# Patient Record
Sex: Female | Born: 1935 | Race: White | Hispanic: No | Smoking: Never smoker
Health system: Southern US, Community
[De-identification: ages and names within clinical notes are randomized; demographics above are authoritative.]

## PROBLEM LIST (undated history)

## (undated) DIAGNOSIS — C73 Malignant neoplasm of thyroid gland: Secondary | ICD-10-CM

## (undated) DIAGNOSIS — A6920 Lyme disease, unspecified: Secondary | ICD-10-CM

---

## 2006-07-24 ENCOUNTER — Encounter: Admission: RE | Admit: 2006-07-24 | Discharge: 2006-07-24 | Payer: Self-pay | Admitting: Internal Medicine

## 2019-03-23 ENCOUNTER — Emergency Department (HOSPITAL_COMMUNITY): Payer: Medicare Other

## 2019-03-23 ENCOUNTER — Encounter (HOSPITAL_COMMUNITY): Payer: Self-pay

## 2019-03-23 ENCOUNTER — Other Ambulatory Visit: Payer: Self-pay

## 2019-03-23 ENCOUNTER — Inpatient Hospital Stay (HOSPITAL_COMMUNITY)
Admission: EM | Admit: 2019-03-23 | Discharge: 2019-03-28 | DRG: 580 | Disposition: A | Discharge status: Home Health | Payer: Medicare Other | Attending: Family Medicine | Admitting: Family Medicine

## 2019-03-23 DIAGNOSIS — N179 Acute kidney failure, unspecified: Secondary | ICD-10-CM | POA: Diagnosis present

## 2019-03-23 DIAGNOSIS — E039 Hypothyroidism, unspecified: Secondary | ICD-10-CM | POA: Diagnosis present

## 2019-03-23 DIAGNOSIS — L03115 Cellulitis of right lower limb: Secondary | ICD-10-CM

## 2019-03-23 DIAGNOSIS — N390 Urinary tract infection, site not specified: Secondary | ICD-10-CM | POA: Diagnosis present

## 2019-03-23 DIAGNOSIS — Z79890 Hormone replacement therapy (postmenopausal): Secondary | ICD-10-CM

## 2019-03-23 DIAGNOSIS — Z20822 Contact with and (suspected) exposure to covid-19: Secondary | ICD-10-CM | POA: Diagnosis present

## 2019-03-23 DIAGNOSIS — I739 Peripheral vascular disease, unspecified: Secondary | ICD-10-CM | POA: Diagnosis present

## 2019-03-23 DIAGNOSIS — T148XXA Other injury of unspecified body region, initial encounter: Secondary | ICD-10-CM | POA: Diagnosis present

## 2019-03-23 DIAGNOSIS — D649 Anemia, unspecified: Secondary | ICD-10-CM | POA: Diagnosis present

## 2019-03-23 DIAGNOSIS — Z85850 Personal history of malignant neoplasm of thyroid: Secondary | ICD-10-CM

## 2019-03-23 DIAGNOSIS — F0390 Unspecified dementia without behavioral disturbance: Secondary | ICD-10-CM | POA: Diagnosis present

## 2019-03-23 DIAGNOSIS — E440 Moderate protein-calorie malnutrition: Secondary | ICD-10-CM | POA: Insufficient documentation

## 2019-03-23 DIAGNOSIS — Z8619 Personal history of other infectious and parasitic diseases: Secondary | ICD-10-CM

## 2019-03-23 DIAGNOSIS — Z882 Allergy status to sulfonamides status: Secondary | ICD-10-CM

## 2019-03-23 DIAGNOSIS — R32 Unspecified urinary incontinence: Secondary | ICD-10-CM | POA: Diagnosis present

## 2019-03-23 DIAGNOSIS — E890 Postprocedural hypothyroidism: Secondary | ICD-10-CM | POA: Diagnosis present

## 2019-03-23 DIAGNOSIS — Z681 Body mass index (BMI) 19 or less, adult: Secondary | ICD-10-CM

## 2019-03-23 DIAGNOSIS — I70201 Unspecified atherosclerosis of native arteries of extremities, right leg: Secondary | ICD-10-CM | POA: Diagnosis present

## 2019-03-23 DIAGNOSIS — L0390 Cellulitis, unspecified: Secondary | ICD-10-CM | POA: Diagnosis present

## 2019-03-23 DIAGNOSIS — Z91010 Allergy to peanuts: Secondary | ICD-10-CM

## 2019-03-23 DIAGNOSIS — E870 Hyperosmolality and hypernatremia: Secondary | ICD-10-CM | POA: Diagnosis present

## 2019-03-23 DIAGNOSIS — Z9102 Food additives allergy status: Secondary | ICD-10-CM

## 2019-03-23 DIAGNOSIS — K625 Hemorrhage of anus and rectum: Secondary | ICD-10-CM | POA: Diagnosis present

## 2019-03-23 DIAGNOSIS — M7989 Other specified soft tissue disorders: Secondary | ICD-10-CM | POA: Diagnosis not present

## 2019-03-23 DIAGNOSIS — R159 Full incontinence of feces: Secondary | ICD-10-CM | POA: Diagnosis present

## 2019-03-23 DIAGNOSIS — L97211 Non-pressure chronic ulcer of right calf limited to breakdown of skin: Secondary | ICD-10-CM | POA: Diagnosis present

## 2019-03-23 DIAGNOSIS — Z888 Allergy status to other drugs, medicaments and biological substances status: Secondary | ICD-10-CM

## 2019-03-23 HISTORY — DX: Lyme disease, unspecified: A69.20

## 2019-03-23 HISTORY — DX: Malignant neoplasm of thyroid gland: C73

## 2019-03-23 LAB — CBC WITH DIFFERENTIAL/PLATELET
Abs Immature Granulocytes: 0.03 10*3/uL (ref 0.00–0.07)
Basophils Absolute: 0 10*3/uL (ref 0.0–0.1)
Basophils Relative: 1 %
Eosinophils Absolute: 0.1 10*3/uL (ref 0.0–0.5)
Eosinophils Relative: 3 %
HCT: 33.6 % — ABNORMAL LOW (ref 36.0–46.0)
Hemoglobin: 10.1 g/dL — ABNORMAL LOW (ref 12.0–15.0)
Immature Granulocytes: 1 %
Lymphocytes Relative: 14 %
Lymphs Abs: 0.7 10*3/uL (ref 0.7–4.0)
MCH: 29 pg (ref 26.0–34.0)
MCHC: 30.1 g/dL (ref 30.0–36.0)
MCV: 96.6 fL (ref 80.0–100.0)
Monocytes Absolute: 0.5 10*3/uL (ref 0.1–1.0)
Monocytes Relative: 9 %
Neutro Abs: 3.9 10*3/uL (ref 1.7–7.7)
Neutrophils Relative %: 72 %
Platelets: 221 10*3/uL (ref 150–400)
RBC: 3.48 MIL/uL — ABNORMAL LOW (ref 3.87–5.11)
RDW: 16.3 % — ABNORMAL HIGH (ref 11.5–15.5)
WBC: 5.3 10*3/uL (ref 4.0–10.5)
nRBC: 0 % (ref 0.0–0.2)

## 2019-03-23 LAB — COMPREHENSIVE METABOLIC PANEL
ALT: 23 U/L (ref 0–44)
AST: 24 U/L (ref 15–41)
Albumin: 3.6 g/dL (ref 3.5–5.0)
Alkaline Phosphatase: 91 U/L (ref 38–126)
Anion gap: 10 (ref 5–15)
BUN: 34 mg/dL — ABNORMAL HIGH (ref 8–23)
CO2: 29 mmol/L (ref 22–32)
Calcium: 9 mg/dL (ref 8.9–10.3)
Chloride: 107 mmol/L (ref 98–111)
Creatinine, Ser: 0.67 mg/dL (ref 0.44–1.00)
GFR calc Af Amer: >60 mL/min (ref >60)
GFR calc non Af Amer: >60 mL/min (ref >60)
Glucose, Bld: 87 mg/dL (ref 70–99)
Potassium: 3.7 mmol/L (ref 3.5–5.1)
Sodium: 146 mmol/L — ABNORMAL HIGH (ref 135–145)
Total Bilirubin: 0.3 mg/dL (ref 0.3–1.2)
Total Protein: 6.3 g/dL — ABNORMAL LOW (ref 6.5–8.1)

## 2019-03-23 LAB — LACTIC ACID, PLASMA: Lactic Acid, Venous: 1.1 mmol/L (ref 0.5–1.9)

## 2019-03-23 MED ORDER — SODIUM CHLORIDE 0.9% FLUSH
3.0000 mL | Freq: Once | INTRAVENOUS | Status: AC
  Administered 2019-03-24: 3 mL via INTRAVENOUS

## 2019-03-23 NOTE — ED Triage Notes (Signed)
Pt came in POV c/o of swelling around wound on Right Leg with slight drainage. Area is warm around the wound. Pt states that the wound was seen by a doctor and was being treated. However, now the wound appears to be getting worse per patient.

## 2019-03-23 NOTE — ED Notes (Signed)
Hiram Gash [phone] (sister) escorted PT to ED and is requesting to be called with any and all updates.

## 2019-03-24 ENCOUNTER — Emergency Department (HOSPITAL_COMMUNITY): Payer: Medicare Other

## 2019-03-24 ENCOUNTER — Inpatient Hospital Stay (HOSPITAL_COMMUNITY): Payer: Medicare Other

## 2019-03-24 ENCOUNTER — Encounter (HOSPITAL_COMMUNITY): Payer: Self-pay | Admitting: Internal Medicine

## 2019-03-24 ENCOUNTER — Other Ambulatory Visit: Payer: Self-pay

## 2019-03-24 DIAGNOSIS — E870 Hyperosmolality and hypernatremia: Secondary | ICD-10-CM | POA: Diagnosis present

## 2019-03-24 DIAGNOSIS — L0390 Cellulitis, unspecified: Secondary | ICD-10-CM | POA: Diagnosis not present

## 2019-03-24 DIAGNOSIS — Z681 Body mass index (BMI) 19 or less, adult: Secondary | ICD-10-CM | POA: Diagnosis not present

## 2019-03-24 DIAGNOSIS — T148XXA Other injury of unspecified body region, initial encounter: Secondary | ICD-10-CM | POA: Diagnosis present

## 2019-03-24 DIAGNOSIS — R32 Unspecified urinary incontinence: Secondary | ICD-10-CM | POA: Diagnosis present

## 2019-03-24 DIAGNOSIS — I739 Peripheral vascular disease, unspecified: Secondary | ICD-10-CM | POA: Diagnosis present

## 2019-03-24 DIAGNOSIS — Z8619 Personal history of other infectious and parasitic diseases: Secondary | ICD-10-CM | POA: Diagnosis not present

## 2019-03-24 DIAGNOSIS — N390 Urinary tract infection, site not specified: Secondary | ICD-10-CM | POA: Diagnosis present

## 2019-03-24 DIAGNOSIS — I96 Gangrene, not elsewhere classified: Secondary | ICD-10-CM | POA: Diagnosis not present

## 2019-03-24 DIAGNOSIS — Z20822 Contact with and (suspected) exposure to covid-19: Secondary | ICD-10-CM | POA: Diagnosis present

## 2019-03-24 DIAGNOSIS — E890 Postprocedural hypothyroidism: Secondary | ICD-10-CM | POA: Diagnosis present

## 2019-03-24 DIAGNOSIS — L03115 Cellulitis of right lower limb: Secondary | ICD-10-CM | POA: Diagnosis present

## 2019-03-24 DIAGNOSIS — D649 Anemia, unspecified: Secondary | ICD-10-CM | POA: Diagnosis present

## 2019-03-24 DIAGNOSIS — K625 Hemorrhage of anus and rectum: Secondary | ICD-10-CM | POA: Diagnosis present

## 2019-03-24 DIAGNOSIS — I70201 Unspecified atherosclerosis of native arteries of extremities, right leg: Secondary | ICD-10-CM | POA: Diagnosis present

## 2019-03-24 DIAGNOSIS — L97211 Non-pressure chronic ulcer of right calf limited to breakdown of skin: Secondary | ICD-10-CM

## 2019-03-24 DIAGNOSIS — Z882 Allergy status to sulfonamides status: Secondary | ICD-10-CM | POA: Diagnosis not present

## 2019-03-24 DIAGNOSIS — M7989 Other specified soft tissue disorders: Secondary | ICD-10-CM | POA: Diagnosis present

## 2019-03-24 DIAGNOSIS — Z91010 Allergy to peanuts: Secondary | ICD-10-CM | POA: Diagnosis not present

## 2019-03-24 DIAGNOSIS — I70232 Atherosclerosis of native arteries of right leg with ulceration of calf: Secondary | ICD-10-CM | POA: Diagnosis not present

## 2019-03-24 DIAGNOSIS — Z9102 Food additives allergy status: Secondary | ICD-10-CM | POA: Diagnosis not present

## 2019-03-24 DIAGNOSIS — F0390 Unspecified dementia without behavioral disturbance: Secondary | ICD-10-CM | POA: Diagnosis present

## 2019-03-24 DIAGNOSIS — E440 Moderate protein-calorie malnutrition: Secondary | ICD-10-CM | POA: Insufficient documentation

## 2019-03-24 DIAGNOSIS — E039 Hypothyroidism, unspecified: Secondary | ICD-10-CM | POA: Diagnosis not present

## 2019-03-24 DIAGNOSIS — R159 Full incontinence of feces: Secondary | ICD-10-CM | POA: Diagnosis present

## 2019-03-24 DIAGNOSIS — Z85850 Personal history of malignant neoplasm of thyroid: Secondary | ICD-10-CM | POA: Diagnosis not present

## 2019-03-24 DIAGNOSIS — N179 Acute kidney failure, unspecified: Secondary | ICD-10-CM | POA: Diagnosis present

## 2019-03-24 DIAGNOSIS — Z888 Allergy status to other drugs, medicaments and biological substances status: Secondary | ICD-10-CM | POA: Diagnosis not present

## 2019-03-24 DIAGNOSIS — Z79890 Hormone replacement therapy (postmenopausal): Secondary | ICD-10-CM | POA: Diagnosis not present

## 2019-03-24 LAB — FERRITIN: Ferritin: 93 ng/mL (ref 11–307)

## 2019-03-24 LAB — RETICULOCYTES
Immature Retic Fract: 11.9 % (ref 2.3–15.9)
RBC.: 3.26 MIL/uL — ABNORMAL LOW (ref 3.87–5.11)
Retic Count, Absolute: 31 10*3/uL (ref 19.0–186.0)
Retic Ct Pct: 1 % (ref 0.4–3.1)

## 2019-03-24 LAB — IRON AND TIBC
Iron: 46 ug/dL (ref 28–170)
Saturation Ratios: 19 % (ref 10.4–31.8)
TIBC: 244 ug/dL — ABNORMAL LOW (ref 250–450)
UIBC: 198 ug/dL

## 2019-03-24 LAB — URINALYSIS, ROUTINE W REFLEX MICROSCOPIC
Bilirubin Urine: NEGATIVE
Glucose, UA: NEGATIVE mg/dL
Hgb urine dipstick: NEGATIVE
Ketones, ur: NEGATIVE mg/dL
Nitrite: NEGATIVE
Protein, ur: NEGATIVE mg/dL
Specific Gravity, Urine: 1.019 (ref 1.005–1.030)
pH: 6 (ref 5.0–8.0)

## 2019-03-24 LAB — SARS CORONAVIRUS 2 BY RT PCR (DIASORIN): SARS Coronavirus 2: NEGATIVE

## 2019-03-24 LAB — VITAMIN B12: Vitamin B-12: 586 pg/mL (ref 180–914)

## 2019-03-24 LAB — TSH: TSH: 7.483 u[IU]/mL — ABNORMAL HIGH (ref 0.350–4.500)

## 2019-03-24 LAB — OCCULT BLOOD X 1 CARD TO LAB, STOOL: Fecal Occult Bld: NEGATIVE

## 2019-03-24 LAB — FOLATE: Folate: 26.7 ng/mL (ref >5.9)

## 2019-03-24 LAB — D-DIMER, QUANTITATIVE: D-Dimer, Quant: 0.93 ug/mL-FEU — ABNORMAL HIGH (ref 0.00–0.50)

## 2019-03-24 MED ORDER — VANCOMYCIN HCL 750 MG/150ML IV SOLN
750.0000 mg | INTRAVENOUS | Status: DC
  Administered 2019-03-25: 750 mg via INTRAVENOUS
  Filled 2019-03-24: qty 150

## 2019-03-24 MED ORDER — PANTOPRAZOLE SODIUM 40 MG PO TBEC
40.0000 mg | DELAYED_RELEASE_TABLET | Freq: Once | ORAL | Status: AC
  Administered 2019-03-24: 40 mg via ORAL
  Filled 2019-03-24: qty 1

## 2019-03-24 MED ORDER — ADULT MULTIVITAMIN W/MINERALS CH
1.0000 | ORAL_TABLET | Freq: Every day | ORAL | Status: DC
  Administered 2019-03-24 – 2019-03-28 (×4): 1 via ORAL
  Filled 2019-03-24 (×4): qty 1

## 2019-03-24 MED ORDER — CEFTRIAXONE SODIUM 1 G IJ SOLR
1.0000 g | INTRAMUSCULAR | Status: DC
  Administered 2019-03-24 – 2019-03-27 (×4): 1 g via INTRAVENOUS
  Filled 2019-03-24 (×4): qty 10

## 2019-03-24 MED ORDER — ACETAMINOPHEN 325 MG PO TABS
650.0000 mg | ORAL_TABLET | Freq: Four times a day (QID) | ORAL | Status: DC | PRN
  Administered 2019-03-24 – 2019-03-25 (×2): 650 mg via ORAL
  Filled 2019-03-24 (×2): qty 2

## 2019-03-24 MED ORDER — VANCOMYCIN HCL IN DEXTROSE 1-5 GM/200ML-% IV SOLN
1000.0000 mg | Freq: Once | INTRAVENOUS | Status: AC
  Administered 2019-03-24: 03:00:00 1000 mg via INTRAVENOUS
  Filled 2019-03-24: qty 200

## 2019-03-24 MED ORDER — ACETAMINOPHEN 650 MG RE SUPP: 650.0000 mg | Freq: Four times a day (QID) | RECTAL | Status: DC | PRN

## 2019-03-24 MED ORDER — PRO-STAT SUGAR FREE PO LIQD
30.0000 mL | Freq: Every day | ORAL | Status: DC
  Administered 2019-03-24 – 2019-03-28 (×4): 30 mL via ORAL
  Filled 2019-03-24 (×4): qty 30

## 2019-03-24 MED ORDER — LEVOTHYROXINE SODIUM 88 MCG PO TABS
88.0000 ug | ORAL_TABLET | Freq: Every day | ORAL | Status: DC
  Administered 2019-03-24 – 2019-03-28 (×5): 88 ug via ORAL
  Filled 2019-03-24 (×5): qty 1

## 2019-03-24 NOTE — Progress Notes (Signed)
ABI exam completed.  Preliminary results can be found under CV proc under chart review.  03/24/2019 4:15 PM  Candia Kingsbury, K., RDMS, RVT
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
## 2019-03-24 NOTE — Progress Notes (Signed)
Pharmacy Antibiotic Note  Autumn Sullivan is a 84 y.o. female admitted on 03/23/2019 with R leg wound.  Pharmacy has been consulted for Vancomycin dosing.  Vancomycin 1gm given in ED  Plan: Vancomycin 750 mg IV Q 36 hrs. Goal AUC 400-550. Expected AUC: 500 SCr used: 0.8 Will f/u renal function, micro data, and pt's clinical condition Vanc levels prn   Height: 5\' 1"  (154.9 cm) Weight: 90 lb (40.8 kg) IBW/kg (Calculated) : 47.8  Temp (24hrs), Avg:97.5 F (36.4 C), Min:97.4 F (36.3 C), Max:97.6 F (36.4 C)  Recent Labs  Lab 03/23/19 2140  WBC 5.3  CREATININE 0.67  LATICACIDVEN 1.1    Estimated Creatinine Clearance: 35.5 mL/min (by C-G formula based on SCr of 0.67 mg/dL).    Allergies  Allergen Reactions  . Methylprednisolone     n/v,tingling n/v,tingling   . Peanut-Containing Drug Products Anaphylaxis  . Cholestyramine     chest pain chest pain   . Soybean (Diagnostic)   . Sulfa Antibiotics     Antimicrobials this admission: 2/24 Vanc >>   Microbiology results:  UCx:     Thank you for allowing pharmacy to be a part of this patient's care.  Sherlon Handing, PharmD, BCPS Please see amion for complete clinical pharmacist phone list 03/24/2019 4:09 AM
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
## 2019-03-24 NOTE — Plan of Care (Signed)
  Problem: Skin Integrity: Goal: Risk for impaired skin integrity will decrease Outcome: Progressing   Problem: Pain Managment: Goal: General experience of comfort will improve Outcome: Progressing   Problem: Elimination: Goal: Will not experience complications related to bowel motility Outcome: Progressing   Problem: Nutrition: Goal: Adequate nutrition will be maintained Outcome: Progressing   Problem: Clinical Measurements: Goal: Ability to maintain clinical measurements within normal limits will improve Outcome: Progressing

## 2019-03-24 NOTE — Progress Notes (Signed)
Progress Note    Autumn Sullivan  [id number] DOB: 1935-06-19  DOA: 03/23/2019 PCP: No primary care provider on file.    Brief Narrative:   Chief complaint: cellulitis  Medical records reviewed and are as summarized below:  Autumn Sullivan is an 84 y.o. female medical history significant for thyroid cancer, Lyme disease [facility] emergency department chief complaint cellulitis of a nonhealing wound on her right leg.  Work-up in the emergency department yields a urinary tract infection and cellulitis of nonhealing wound on right leg concerning for ischemic ulcer and PVD.  The biotics were started in the emergency department.  Assessment/Plan:   Principal Problem:   Cellulitis Active Problems:   UTI (urinary tract infection)   Wound of skin   Ischemic ulcer of right calf, limited to breakdown of skin (HCC)   Anemia   PVD (peripheral vascular disease) (HCC)   Hypernatremia   Hypothyroidism   #1.  Cellulitis/possible ischemic ulcer right calf.  Reportedly this is from a wound she sustained 3 years ago that never healed properly.  Last 2 weeks has worsened.  X-ray of right tibia/fibula shows soft tissue ulceration with lateral aspect of the right lower leg with associated edema compatible with cellulitis no evidence of osteomyelitis.  She is afebrile hemodynamically stable and nontoxic-appearing.  Myosin and ceftriaxone initiated.  Valuated by orthopedic surgery who recommend ABI studies with possible vascular consult depending on results.  Also recommend likely needs debridement. -Gentle IV fluids -Continue IV antibiotics -Follow ABI studies -Vascular consult as indicated -Dressing changes per W OC  #2.  Urinary tract infection.  Rocephin started. -Follow urine culture -Gentle IV fluids -Monitor intake and output  #3.  Anemia.  Normocytic.  Hemoglobin 10.2.  Chart review indicates hemoglobin 10.6 in 2018.  Anemia panel reveals TIBC 244 otherwise levels within the limits of normal.  No  sign symptoms of active bleeding. -Monitor  #4.  Hypothyroidism.  Home medications include Synthroid. -Obtain a TSH -Continue Synthroid  #5. Hypernatremia. Sodium level 146. Mild.  We related to decreased oral intake. -Received IV fluids in the emergency department -Recheck in the morning   Family Communication/Anticipated D/C date and plan/Code Status   DVT prophylaxis: Lovenox ordered. Code Status: Full Code.  Family Communication: attempted to call sister who is listed as contact and no answer and no opportunity to leave message Disposition Plan: to be determined   Medical Consultants:    Sharol Given ortho   Anti-Infectives:    None  Subjective:   Lying in bed. Reports pain to lower right leg.   Objective:    Vitals:   03/24/19 0230 03/24/19 0339 03/24/19 0341 03/24/19 0756  BP: (!) 156/62  (!) 151/53 109/85  Pulse: 67 70 63 60  Resp:  16 17 16   Temp:  (!) 97.4 F (36.3 C)  97.9 F (36.6 C)  TempSrc:  Oral  Oral  SpO2: 100% 100% 100% 99%  Weight:      Height:        Intake/Output Summary (Last 24 hours) at 03/24/2019 1303 Last data filed at 03/24/2019 0406 Gross per 24 hour  Intake 300 ml  Output --  Net 300 ml   Filed Weights   03/23/19 2128  Weight: 40.8 kg    Exam: General: Thin frail somewhat pale tonically ill-appearing alert no acute distress CV: Regular rate and rhythm no murmur gallop or rub trace lower extremity edema right foot warm to touch with +DP.  Respiratory: No increased work of breathing  respirations slightly shallow breath sounds are clear bilaterally I hear no crackles no wheezes Abdomen: Nondistended soft positive bowel sounds throughout no guarding or rebounding Neuro: Alert and oriented to self and place.  Speech clear facial symmetry follow simple commands some word searching noted Psychiatric: seems a little paranoid as not wanting to answer questions about where she lives or who she lives with .   Data Reviewed:   I have  personally reviewed following labs and imaging studies:  Labs: Labs show the following:   Basic Metabolic Panel: Recent Labs  Lab 03/23/19 2140  NA 146*  K 3.7  CL 107  CO2 29  GLUCOSE 87  BUN 34*  CREATININE 0.67  CALCIUM 9.0   GFR Estimated Creatinine Clearance: 35.5 mL/min (by C-G formula based on SCr of 0.67 mg/dL). Liver Function Tests: Recent Labs  Lab 03/23/19 2140  AST 24  ALT 23  ALKPHOS 91  BILITOT 0.3  PROT 6.3*  ALBUMIN 3.6   No results for input(s): LIPASE, AMYLASE in the last 168 hours. No results for input(s): AMMONIA in the last 168 hours. Coagulation profile No results for input(s): INR, PROTIME in the last 168 hours.  CBC: Recent Labs  Lab 03/23/19 2140  WBC 5.3  NEUTROABS 3.9  HGB 10.1*  HCT 33.6*  MCV 96.6  PLT 221   Cardiac Enzymes: No results for input(s): CKTOTAL, CKMB, CKMBINDEX, TROPONINI in the last 168 hours. BNP (last 3 results) No results for input(s): PROBNP in the last 8760 hours. CBG: No results for input(s): GLUCAP in the last 168 hours. D-Dimer: Recent Labs    03/24/19 0402  DDIMER 0.93*   Hgb A1c: No results for input(s): HGBA1C in the last 72 hours. Lipid Profile: No results for input(s): CHOL, HDL, LDLCALC, TRIG, CHOLHDL, LDLDIRECT in the last 72 hours. Thyroid function studies: No results for input(s): TSH, T4TOTAL, T3FREE, THYROIDAB in the last 72 hours.  Invalid input(s): FREET3 Anemia work up: Recent Labs    03/24/19 0402  VITAMINB12 586  FOLATE 26.7  FERRITIN 93  TIBC 244*  IRON 46  RETICCTPCT 1.0   Sepsis Labs: Recent Labs  Lab 03/23/19 2140  WBC 5.3  LATICACIDVEN 1.1    Microbiology Recent Results (from the past 240 hour(s))  SARS Coronavirus 2 by RT PCR     Status: None   Collection Time: 03/24/19  2:27 AM  Result Value Ref Range Status   SARS Coronavirus 2 NEGATIVE NEGATIVE Final    Comment: (NOTE) Result indicates the ABSENCE of SARS-CoV-2 RNA in the patient specimen.  The  lowest concentration of SARS-CoV-2 viral copies this assay can detect in nasopharyngeal swab specimens is 500 copies / mL.  A negative result does not preclude SARS-CoV-2 infection and should not be used as the sole basis for patient management decisions. A negative result may occur with improper specimen collection / handling, submission of a specimen other than nasopharyngeal swab, presence of viral mutation(s) within the areas targeted by this assay, and inadequate number of viral copies (<500 copies / mL) present.  Negative results must be combined with clinical observations, patient history, and epidemiological information.  The expected result is NEGATIVE.  Patient Fact Sheet:  BlogSelections.co.uk   Provider Fact Sheet:  https://lucas.com/   This test is not yet approved or cleared by the [place] FDA and  has been authorized for  detection and/or diagnosis of SARS-CoV-2 by FDA under an Emergency Use Authorization (EUA).  This EUA will remain in effect (meaning  this test can be used) for the duration of  the COVID-19 declaration under Section 564(b)(1) of the Act, 21 U.S.C. section 360bbb-3(b)(1), unless the authorization is terminated or revoked sooner Performed at [facility], [place] [address], [place],  [address]     Procedures and diagnostic studies:  DG Chest 2 View  Result Date: 03/23/2019 CLINICAL DATA:  Infection, history of Lyme disease and thyroid cancer EXAM: CHEST - 2 VIEW COMPARISON:  None. FINDINGS: Frontal and lateral views of the chest demonstrate an unremarkable cardiac silhouette. The lungs are hyperinflated with background interstitial prominence compatible with emphysema. No airspace disease, effusion, or pneumothorax. No acute bony abnormalities. IMPRESSION: 1. Emphysema.  No acute airspace disease. Electronically Signed   By: Randa Ngo M.D.   On: 03/23/2019 22:02   DG Tibia/Fibula  Right  Result Date: 03/24/2019 CLINICAL DATA:  Right leg pain and swelling, erythema and drainage EXAM: RIGHT TIBIA AND FIBULA - 2 VIEW COMPARISON:  None. FINDINGS: Frontal and lateral views of the right tibia and fibula are obtained. There are no acute or destructive bony lesions. Soft tissue defect lateral right lower leg consistent with ulceration. There is diffuse soft tissue edema. IMPRESSION: 1. Soft tissue ulceration lateral right lower leg with associated diffuse soft tissue edema. Findings compatible with cellulitis. 2. No acute or destructive bony lesions. Electronically Signed   By: Randa Ngo M.D.   On: 03/24/2019 02:16    Medications:   . levothyroxine  88 mcg Oral Q0600   Continuous Infusions: . cefTRIAXone (ROCEPHIN)  IV 1 g (03/24/19 0406)  . [START ON 03/25/2019] vancomycin       LOS: 0 days   Radene Gunning NP [facility]   How to contact the [facility] Attending or Consulting provider [place] or covering provider during after hours [place], for this patient?  1. Check the care team in [facility] and look for a) attending/consulting [facility] provider listed and b) the [facility] team listed 2. Log into [email or web address] and use [place]'s universal password to access. If you do not have the password, please contact the hospital operator. 3. Locate the [facility] provider you are looking for under [facility] and page to a number that you can be directly reached. 4. If you still have difficulty reaching the provider, please page the [facility] (Director on Call) for the Hospitalists listed on amion for assistance.  03/24/2019, 1:03 PM

## 2019-03-24 NOTE — ED Provider Notes (Signed)
[facility] EMERGENCY DEPARTMENT Provider Note   CSN: [id number] Arrival date & time: 03/23/19  2029     History No chief complaint on file.   Autumn Sullivan is a 84 y.o. female.  Patient is an 84 year old female with history of thyroid cancer and chronic right leg wound.  She presents today for evaluation of pain and swelling to the right leg.  The wound she describes has been present for the past 2 years.  It has worsened over the past several days.  She describes pain, redness, and pus draining from it.  She denies any fevers or chills.  She denies aggravating or alleviating factors.   The history is provided by the patient.       Past Medical History:  Diagnosis Date  . Lyme disease   . Thyroid cancer ([place])     There are no problems to display for this patient.      OB History   No obstetric history on file.     No family history on file.  Social History   Tobacco Use  . Smoking status: Not on file  Substance Use Topics  . Alcohol use: Not on file  . Drug use: Not on file    Home Medications Prior to Admission medications   Not on File    Allergies    Patient has no allergy information on record.  Review of Systems   Review of Systems  All other systems reviewed and are negative.   Physical Exam Updated Vital Signs BP (!) 119/94 (BP Location: Right Arm)   Pulse 85   Temp 97.6 F (36.4 C)   Resp 16   Ht 5\' 1"  (1.549 m)   Wt 40.8 kg   SpO2 100%   BMI 17.01 kg/m   Physical Exam Vitals and nursing note reviewed.  Constitutional:      General: She is not in acute distress.    Appearance: She is well-developed. She is not diaphoretic.  HENT:     Head: Normocephalic and atraumatic.  Cardiovascular:     Rate and Rhythm: Normal rate and regular rhythm.     Heart sounds: No murmur. No friction rub. No gallop.   Pulmonary:     Effort: Pulmonary effort is normal. No respiratory distress.     Breath sounds: Normal breath  sounds. No wheezing.  Abdominal:     General: Bowel sounds are normal. There is no distension.     Palpations: Abdomen is soft.     Tenderness: There is no abdominal tenderness.  Musculoskeletal:        General: Normal range of motion.     Cervical back: Normal range of motion and neck supple.  Skin:    General: Skin is warm and dry.     Comments: The right lower extremity has a 5 cm x 7 cm area of ulcerated tissue.  There is purulent drainage and surrounding tenderness, warmth, and erythema.  DP pulses are palpable and motor and sensation are intact to the foot.  Neurological:     Mental Status: She is alert and oriented to person, place, and time.       ED Results / Procedures / Treatments   Labs (all labs ordered are listed, but only abnormal results are displayed) Labs Reviewed  COMPREHENSIVE METABOLIC PANEL - Abnormal; Notable for the following components:      Result Value   Sodium 146 (*)    BUN 34 (*)  Total Protein 6.3 (*)    All other components within normal limits  CBC WITH DIFFERENTIAL/PLATELET - Abnormal; Notable for the following components:   RBC 3.48 (*)    Hemoglobin 10.1 (*)    HCT 33.6 (*)    RDW 16.3 (*)    All other components within normal limits  URINALYSIS, ROUTINE W REFLEX MICROSCOPIC - Abnormal; Notable for the following components:   APPearance HAZY (*)    Leukocytes,Ua MODERATE (*)    Bacteria, UA MANY (*)    All other components within normal limits  LACTIC ACID, PLASMA  LACTIC ACID, PLASMA    EKG None  Radiology DG Chest 2 View  Result Date: 03/23/2019 CLINICAL DATA:  Infection, history of Lyme disease and thyroid cancer EXAM: CHEST - 2 VIEW COMPARISON:  None. FINDINGS: Frontal and lateral views of the chest demonstrate an unremarkable cardiac silhouette. The lungs are hyperinflated with background interstitial prominence compatible with emphysema. No airspace disease, effusion, or pneumothorax. No acute bony abnormalities. IMPRESSION:  1. Emphysema.  No acute airspace disease. Electronically Signed   By: Randa Ngo M.D.   On: 03/23/2019 22:02    Procedures Procedures (including critical care time)  Medications Ordered in ED Medications  sodium chloride flush (NS) 0.9 % injection 3 mL (has no administration in time range)  vancomycin (VANCOCIN) IVPB 1000 mg/200 mL premix (has no administration in time range)    ED Course  I have reviewed the triage vital signs and the nursing notes.  Pertinent labs & imaging results that were available during my care of the patient were reviewed by me and considered in my medical decision making (see chart for details).    MDM Rules/Calculators/A&P  Patient presenting with a recurring sore to her right lower leg.  She has a sizable area of necrotic tissue with purulent drainage and surrounding erythema.  I feel as though this will require recurrent doses of antibiotics intravenously.  Patient's care has been discussed with Dr. Marlowe Sax who will evaluate and admit.  Final Clinical Impression(s) / ED Diagnoses Final diagnoses:  None    Rx / DC Orders ED Discharge Orders    None       Veryl Speak, MD 03/24/19 [phone]

## 2019-03-24 NOTE — Consult Note (Signed)
ORTHOPAEDIC CONSULTATION  REQUESTING PHYSICIAN: Geradine Girt, DO  Chief Complaint: Painful ulcer lateral aspect right leg.  HPI: Autumn Sullivan is a 84 y.o. female who presents with a painful ulcer lateral aspect right leg secondary to blunt trauma which has been present for over a year.  Patient denies recent history of smoking, she states that she has leg pain which occurs after walking a certain distance she states its only in her right leg not in her left leg.  Past Medical History:  Diagnosis Date  . Lyme disease   . Thyroid cancer ([place])    History reviewed. No pertinent surgical history. Social History   Socioeconomic History  . Marital status: Unknown    Spouse name: Not on file  . Number of children: Not on file  . Years of education: Not on file  . Highest education level: Not on file  Occupational History  . Not on file  Tobacco Use  . Smoking status: Never Smoker  . Smokeless tobacco: Never Used  Substance and Sexual Activity  . Alcohol use: Never  . Drug use: Never  . Sexual activity: Not on file  Other Topics Concern  . Not on file  Social History Narrative  . Not on file   Social Determinants of Health   Financial Resource Strain:   . Difficulty of Paying Living Expenses: Not on file  Food Insecurity:   . Worried About Charity fundraiser in the Last Year: Not on file  . Ran Out of Food in the Last Year: Not on file  Transportation Needs:   . Lack of Transportation (Medical): Not on file  . Lack of Transportation (Non-Medical): Not on file  Physical Activity:   . Days of Exercise per Week: Not on file  . Minutes of Exercise per Session: Not on file  Stress:   . Feeling of Stress : Not on file  Social Connections:   . Frequency of Communication with Friends and Family: Not on file  . Frequency of Social Gatherings with Friends and Family: Not on file  . Attends Religious Services: Not on file  . Active Member of Clubs or Organizations: Not  on file  . Attends Archivist Meetings: Not on file  . Marital Status: Not on file   History reviewed. No pertinent family history. - negative except otherwise stated in the family history section Allergies  Allergen Reactions  . Methylprednisolone     n/v,tingling n/v,tingling   . Peanut-Containing Drug Products Anaphylaxis  . Cholestyramine     chest pain chest pain   . Soybean (Diagnostic)   . Sulfa Antibiotics    Prior to Admission medications   Medication Sig Start Date End Date Taking? Authorizing Provider  levothyroxine (SYNTHROID) 88 MCG tablet Take 88 mcg by mouth daily. 12/17/18  Yes [provider]   DG Chest 2 View  Result Date: 03/23/2019 CLINICAL DATA:  Infection, history of Lyme disease and thyroid cancer EXAM: CHEST - 2 VIEW COMPARISON:  None. FINDINGS: Frontal and lateral views of the chest demonstrate an unremarkable cardiac silhouette. The lungs are hyperinflated with background interstitial prominence compatible with emphysema. No airspace disease, effusion, or pneumothorax. No acute bony abnormalities. IMPRESSION: 1. Emphysema.  No acute airspace disease. Electronically Signed   By: Randa Ngo M.D.   On: 03/23/2019 22:02   DG Tibia/Fibula Right  Result Date: 03/24/2019 CLINICAL DATA:  Right leg pain and swelling, erythema and drainage EXAM: RIGHT TIBIA AND FIBULA -  2 VIEW COMPARISON:  None. FINDINGS: Frontal and lateral views of the right tibia and fibula are obtained. There are no acute or destructive bony lesions. Soft tissue defect lateral right lower leg consistent with ulceration. There is diffuse soft tissue edema. IMPRESSION: 1. Soft tissue ulceration lateral right lower leg with associated diffuse soft tissue edema. Findings compatible with cellulitis. 2. No acute or destructive bony lesions. Electronically Signed   By: Randa Ngo M.D.   On: 03/24/2019 02:16   - pertinent xrays, CT, MRI studies were reviewed and independently  interpreted  Positive ROS: All other systems have been reviewed and were otherwise negative with the exception of those mentioned in the HPI and as above.  Physical Exam: General: Alert, no acute distress Psychiatric: Patient is competent for consent with normal mood and affect Lymphatic: No axillary or cervical lymphadenopathy Cardiovascular: No pedal edema Respiratory: No cyanosis, no use of accessory musculature GI: No organomegaly, abdomen is soft and non-tender    Images:  @ENCIMAGES @  Labs:  No results found for: HGBA1C, ESRSEDRATE, CRP, LABURIC, REPTSTATUS, GRAMSTAIN, CULT, LABORGA  Lab Results  Component Value Date   ALBUMIN 3.6 03/23/2019    Neurologic: Patient does not have protective sensation bilateral lower extremities.   MUSCULOSKELETAL:   Skin: Examination patient has necrotic ulcer with surrounding dermatitis lateral aspect right calf.  The ulcer is approximately 3 cm in diameter 3 mm deep.  There is necrotic tissue at the base of the wound.  The leg is tender to palpation.  Patient does not have palpable pulses with the Doppler patient has a monophasic dorsalis pedis and posterior tibial pulse.  Patient states that with walking after certain distance she has claudication type pain in the right leg.  Assessment: Assessment: Peripheral vascular disease with ischemic ulcer lateral aspect right leg with claudication-like symptoms.  Plan: Plan: I will order ankle-brachial indices recommend vascular vein surgery consultation to see if patient is a revascularization candidate.  Thank you for the consult and the opportunity to see [name], MD [place] [phone] 11:30 AM
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
## 2019-03-25 DIAGNOSIS — R8290 Unspecified abnormal findings in urine: Secondary | ICD-10-CM

## 2019-03-25 DIAGNOSIS — E039 Hypothyroidism, unspecified: Secondary | ICD-10-CM

## 2019-03-25 DIAGNOSIS — T148XXA Other injury of unspecified body region, initial encounter: Secondary | ICD-10-CM

## 2019-03-25 DIAGNOSIS — E870 Hyperosmolality and hypernatremia: Secondary | ICD-10-CM

## 2019-03-25 DIAGNOSIS — D649 Anemia, unspecified: Secondary | ICD-10-CM

## 2019-03-25 DIAGNOSIS — I70232 Atherosclerosis of native arteries of right leg with ulceration of calf: Secondary | ICD-10-CM

## 2019-03-25 LAB — BASIC METABOLIC PANEL
Anion gap: 4 — ABNORMAL LOW (ref 5–15)
BUN: 30 mg/dL — ABNORMAL HIGH (ref 8–23)
CO2: 28 mmol/L (ref 22–32)
Calcium: 7.9 mg/dL — ABNORMAL LOW (ref 8.9–10.3)
Chloride: 108 mmol/L (ref 98–111)
Creatinine, Ser: 0.7 mg/dL (ref 0.44–1.00)
GFR calc Af Amer: >60 mL/min (ref >60)
GFR calc non Af Amer: >60 mL/min (ref >60)
Glucose, Bld: 90 mg/dL (ref 70–99)
Potassium: 4.1 mmol/L (ref 3.5–5.1)
Sodium: 140 mmol/L (ref 135–145)

## 2019-03-25 LAB — URINE CULTURE: Culture: <10000 — AB

## 2019-03-25 MED ORDER — ENOXAPARIN SODIUM 40 MG/0.4ML ~~LOC~~ SOLN
40.0000 mg | SUBCUTANEOUS | Status: DC
  Filled 2019-03-25: qty 0.4

## 2019-03-25 MED ORDER — LACTATED RINGERS IV SOLN: INTRAVENOUS | Status: DC

## 2019-03-25 MED ORDER — ENOXAPARIN SODIUM 30 MG/0.3ML ~~LOC~~ SOLN
30.0000 mg | SUBCUTANEOUS | Status: DC
  Administered 2019-03-25 – 2019-03-27 (×2): 30 mg via SUBCUTANEOUS
  Filled 2019-03-25: qty 0.3
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
## 2019-03-25 NOTE — Progress Notes (Signed)
Patient right wound dressing and care completed as ordered, will continue to monitor.
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
## 2019-03-25 NOTE — Progress Notes (Signed)
RLE  Non healing ulcer. Kerlix in place dressing dry. Some surrounding erythema  ABI's reviewed. Monophasic pattern in R LE. ABI .7. Will obtain vascular consult

## 2019-03-26 ENCOUNTER — Encounter (HOSPITAL_COMMUNITY)
Admission: EM | Disposition: A | Discharge status: Home Health | Payer: Self-pay | Source: Home / Self Care | Attending: Internal Medicine

## 2019-03-26 HISTORY — PX: PERIPHERAL VASCULAR BALLOON ANGIOPLASTY: CATH118281

## 2019-03-26 HISTORY — PX: ABDOMINAL AORTOGRAM W/LOWER EXTREMITY: CATH118223

## 2019-03-26 LAB — POCT ACTIVATED CLOTTING TIME
Activated Clotting Time: 197 seconds
Activated Clotting Time: 230 seconds
Activated Clotting Time: 186 seconds
Activated Clotting Time: 175 seconds

## 2019-03-26 LAB — BASIC METABOLIC PANEL
Anion gap: 7 (ref 5–15)
BUN: 20 mg/dL (ref 8–23)
CO2: 32 mmol/L (ref 22–32)
Calcium: 8.3 mg/dL — ABNORMAL LOW (ref 8.9–10.3)
Chloride: 102 mmol/L (ref 98–111)
Creatinine, Ser: 0.67 mg/dL (ref 0.44–1.00)
GFR calc Af Amer: >60 mL/min (ref >60)
GFR calc non Af Amer: >60 mL/min (ref >60)
Glucose, Bld: 85 mg/dL (ref 70–99)
Potassium: 4.1 mmol/L (ref 3.5–5.1)
Sodium: 141 mmol/L (ref 135–145)

## 2019-03-26 LAB — CBC
HCT: 35.2 % — ABNORMAL LOW (ref 36.0–46.0)
Hemoglobin: 11.2 g/dL — ABNORMAL LOW (ref 12.0–15.0)
MCH: 29.7 pg (ref 26.0–34.0)
MCHC: 31.8 g/dL (ref 30.0–36.0)
MCV: 93.4 fL (ref 80.0–100.0)
Platelets: 196 10*3/uL (ref 150–400)
RBC: 3.77 MIL/uL — ABNORMAL LOW (ref 3.87–5.11)
RDW: 16 % — ABNORMAL HIGH (ref 11.5–15.5)
WBC: 4.1 10*3/uL (ref 4.0–10.5)
nRBC: 0 % (ref 0.0–0.2)

## 2019-03-26 SURGERY — ABDOMINAL AORTOGRAM W/LOWER EXTREMITY
Anesthesia: LOCAL | Laterality: Right

## 2019-03-26 MED ORDER — FENTANYL CITRATE (PF) 100 MCG/2ML IJ SOLN
INTRAMUSCULAR | Status: AC
  Filled 2019-03-26: qty 2

## 2019-03-26 MED ORDER — SODIUM CHLORIDE 0.9 % IV SOLN: 250.0000 mL | INTRAVENOUS | Status: DC | PRN

## 2019-03-26 MED ORDER — HYDRALAZINE HCL 20 MG/ML IJ SOLN
INTRAMUSCULAR | Status: AC
  Filled 2019-03-26: qty 1

## 2019-03-26 MED ORDER — OXYCODONE HCL 5 MG PO TABS
5.0000 mg | ORAL_TABLET | ORAL | Status: DC | PRN
  Administered 2019-03-26: 5 mg via ORAL
  Filled 2019-03-26: qty 1

## 2019-03-26 MED ORDER — FENTANYL CITRATE (PF) 100 MCG/2ML IJ SOLN
INTRAMUSCULAR | Status: DC | PRN
  Administered 2019-03-26: 12.5 ug via INTRAVENOUS

## 2019-03-26 MED ORDER — IODIXANOL 320 MG/ML IV SOLN
INTRAVENOUS | Status: DC | PRN
  Administered 2019-03-26: 12:00:00 140 mL via INTRA_ARTERIAL

## 2019-03-26 MED ORDER — ASPIRIN EC 81 MG PO TBEC
81.0000 mg | DELAYED_RELEASE_TABLET | Freq: Every day | ORAL | Status: DC
  Administered 2019-03-26 – 2019-03-28 (×3): 81 mg via ORAL
  Filled 2019-03-26 (×3): qty 1

## 2019-03-26 MED ORDER — HEPARIN (PORCINE) IN NACL 1000-0.9 UT/500ML-% IV SOLN
INTRAVENOUS | Status: DC | PRN
  Administered 2019-03-26 (×2): 500 mL

## 2019-03-26 MED ORDER — ATROPINE SULFATE 1 MG/10ML IJ SOSY
PREFILLED_SYRINGE | INTRAMUSCULAR | Status: AC
  Filled 2019-03-26: qty 10

## 2019-03-26 MED ORDER — ONDANSETRON HCL 4 MG/2ML IJ SOLN: 4.0000 mg | Freq: Four times a day (QID) | INTRAMUSCULAR | Status: DC | PRN

## 2019-03-26 MED ORDER — HALOPERIDOL LACTATE 5 MG/ML IJ SOLN
5.0000 mg | Freq: Once | INTRAMUSCULAR | Status: AC
  Administered 2019-03-27: 5 mg via INTRAVENOUS
  Filled 2019-03-26: qty 1

## 2019-03-26 MED ORDER — LABETALOL HCL 5 MG/ML IV SOLN: 10.0000 mg | INTRAVENOUS | Status: DC | PRN

## 2019-03-26 MED ORDER — SODIUM CHLORIDE 0.9 % IV SOLN: INTRAVENOUS | Status: AC

## 2019-03-26 MED ORDER — HEPARIN SODIUM (PORCINE) 1000 UNIT/ML IJ SOLN
INTRAMUSCULAR | Status: AC
  Filled 2019-03-26: qty 1

## 2019-03-26 MED ORDER — LIDOCAINE HCL (PF) 1 % IJ SOLN
INTRAMUSCULAR | Status: AC
  Filled 2019-03-26: qty 30

## 2019-03-26 MED ORDER — HEPARIN SODIUM (PORCINE) 1000 UNIT/ML IJ SOLN
INTRAMUSCULAR | Status: DC | PRN
  Administered 2019-03-26: 5000 [IU] via INTRAVENOUS
  Administered 2019-03-26: 2000 [IU] via INTRAVENOUS

## 2019-03-26 MED ORDER — SODIUM CHLORIDE 0.9% FLUSH: 3.0000 mL | INTRAVENOUS | Status: DC | PRN

## 2019-03-26 MED ORDER — HEPARIN (PORCINE) IN NACL 1000-0.9 UT/500ML-% IV SOLN
INTRAVENOUS | Status: AC
  Filled 2019-03-26: qty 1000

## 2019-03-26 MED ORDER — CLOPIDOGREL BISULFATE 75 MG PO TABS
300.0000 mg | ORAL_TABLET | Freq: Once | ORAL | Status: AC
  Filled 2019-03-26: qty 4

## 2019-03-26 MED ORDER — SODIUM CHLORIDE 0.9 % IV SOLN: INTRAVENOUS | Status: DC

## 2019-03-26 MED ORDER — CLOPIDOGREL BISULFATE 75 MG PO TABS
75.0000 mg | ORAL_TABLET | Freq: Every day | ORAL | Status: DC
  Administered 2019-03-27 – 2019-03-28 (×2): 75 mg via ORAL
  Filled 2019-03-26 (×2): qty 1

## 2019-03-26 MED ORDER — SODIUM CHLORIDE 0.9% FLUSH: 3.0000 mL | Freq: Two times a day (BID) | INTRAVENOUS | Status: DC

## 2019-03-26 MED ORDER — LIDOCAINE HCL (PF) 1 % IJ SOLN
INTRAMUSCULAR | Status: DC | PRN
  Administered 2019-03-26: 20 mL via INTRADERMAL

## 2019-03-26 MED ORDER — ROSUVASTATIN CALCIUM 5 MG PO TABS
10.0000 mg | ORAL_TABLET | Freq: Every day | ORAL | Status: DC
  Administered 2019-03-26 – 2019-03-27 (×2): 10 mg via ORAL
  Filled 2019-03-26 (×2): qty 2

## 2019-03-26 MED ORDER — CLOPIDOGREL BISULFATE 300 MG PO TABS
ORAL_TABLET | ORAL | Status: DC | PRN
  Administered 2019-03-26: 300 mg via ORAL

## 2019-03-26 MED ORDER — MORPHINE SULFATE (PF) 2 MG/ML IV SOLN: 2.0000 mg | INTRAVENOUS | Status: DC | PRN

## 2019-03-26 MED ORDER — HYDRALAZINE HCL 20 MG/ML IJ SOLN
5.0000 mg | INTRAMUSCULAR | Status: DC | PRN
  Administered 2019-03-26: 5 mg via INTRAVENOUS

## 2019-03-26 SURGICAL SUPPLY — 22 items
BALLN STERLING OTW 6X40X135 (BALLOONS) ×3
BALLOON STERLING OTW 6X40X135 (BALLOONS) IMPLANT
CATH ANGIO 5F PIGTAIL 65CM (CATHETERS) ×1 IMPLANT
CATH CROSS OVER TEMPO 5F (CATHETERS) ×2 IMPLANT
CATH QUICKCROSS SUPP .035X90CM (MICROCATHETER) ×2 IMPLANT
DCB RANGER 6.0X40 135 (BALLOONS) IMPLANT
DEVICE TORQUE .025-.038 (MISCELLANEOUS) ×1 IMPLANT
GUIDEWIRE ANGLED .035X150CM (WIRE) ×1 IMPLANT
KIT ENCORE 26 ADVANTAGE (KITS) ×1 IMPLANT
KIT PV (KITS) ×3 IMPLANT
RANGER DCB 6.0X40 135 (BALLOONS) ×3
SHEATH FLEX ANSEL ANG 6F 45CM (SHEATH) ×1 IMPLANT
SHEATH PINNACLE 5F 10CM (SHEATH) ×1 IMPLANT
SHEATH PINNACLE 6F 10CM (SHEATH) ×1 IMPLANT
SHEATH PROBE COVER 6X72 (BAG) ×1 IMPLANT
STOPCOCK MORSE 400PSI 3WAY (MISCELLANEOUS) ×1 IMPLANT
SYR MEDRAD MARK 7 150ML (SYRINGE) ×1 IMPLANT
SYR MEDRAD MARK V 150ML (SYRINGE) ×1 IMPLANT
TRANSDUCER W/STOPCOCK (MISCELLANEOUS) ×3 IMPLANT
TRAY PV CATH (CUSTOM PROCEDURE TRAY) ×3 IMPLANT
WIRE BENTSON .035X145CM (WIRE) ×1 IMPLANT
WIRE G V18X300CM (WIRE) ×2 IMPLANT

## 2019-03-26 NOTE — Discharge Instructions (Signed)
° °  [facility] [place] ° °Discharge Instructions ° °Lower Extremity Angiogram; Angioplasty/Stenting ° °Please refer to the following instructions for your post-procedure care. Your surgeon or physician assistant will discuss any changes with you. ° °Activity ° °Avoid lifting more than 8 pounds (1 gallons of milk) for 72 hours (3 days) after your procedure. You may walk as much as you can tolerate. It's OK to drive after 72 hours. ° °Bathing/Showering ° °You may shower the day after your procedure. If you have a bandage, you may remove it at 24- 48 hours. Clean your incision site with mild soap and water. Pat the area dry with a clean towel. ° °Diet ° °Resume your pre-procedure diet. There are no special food restrictions following this procedure. All patients with peripheral vascular disease should follow a low fat/low cholesterol diet. In order to heal from your surgery, it is CRITICAL to get adequate nutrition. Your body requires vitamins, minerals, and protein. Vegetables are the best source of vitamins and minerals. Vegetables also provide the perfect balance of protein. Processed food has little nutritional value, so try to avoid this. ° °Medications ° °Resume taking all of your medications unless your doctor tells you not to. If your incision is causing pain, you may take over-the-counter pain relievers such as acetaminophen (Tylenol) ° °Follow Up ° °Follow up will be arranged at the time of your procedure. You may have an office visit scheduled or may be scheduled for surgery. Ask your surgeon if you have any questions. ° °Please call us immediately for any of the following conditions: °•Severe or worsening pain your legs or feet at rest or with walking. °•Increased pain, redness, drainage at your groin puncture site. °•Fever of 101 degrees or higher. °•If you have any mild or slow bleeding from your puncture site: lie down, apply firm constant pressure over the area with a piece of  gauze or a clean wash cloth for 30 minutes- no peeking!, call 911 right away if you are still bleeding after 30 minutes, or if the bleeding is heavy and unmanageable. ° °Reduce your risk factors of vascular disease: ° °Stop smoking. If you would like help call [facility] at 1-800-QUIT-NOW (1-800-784-8669) or [place] at 336-586-4000. °Manage your cholesterol °Maintain a desired weight °Control your diabetes °Keep your blood pressure down ° °If you have any questions, please call the office at [phone] ° °

## 2019-03-26 NOTE — Interval H&P Note (Signed)
History and Physical Interval Note:  03/26/2019 9:49 AM  Mancel Parsons  has presented today for surgery, with the diagnosis of PAD.  The various methods of treatment have been discussed with the patient and family. After consideration of risks, benefits and other options for treatment, the patient has consented to  Procedure(s): ABDOMINAL AORTOGRAM W/LOWER EXTREMITY (Left) as a surgical intervention.  The patient's history has been reviewed, patient examined, no change in status, stable for surgery.  I have reviewed the patient's chart and labs.  Questions were answered to the patient's satisfaction.     Ruta Hinds

## 2019-03-26 NOTE — Progress Notes (Addendum)
PROGRESS NOTE    Autumn Sullivan  TZG:[id number]  DOB: September 21, 1935  PCP: No primary care provider on file. Admit date:03/23/2019  84 y.o. female medical history significant for thyroid cancer, Lyme disease presented with cellulitis around a nonhealing wound on her right leg.  Reportedly this is from a wound she sustained 3 years ago that never healed properly.  Last 2 weeks has worsened.  ED Course: Afebrile.Work-up revealed mild hypernatremia,  urinary tract infection and cellulitis of nonhealing wound on right leg concerning for ischemic ulcer and PVD.  X-ray of right tibia/fibula shows soft tissue ulceration with lateral aspect of the right lower leg with associated edema compatible with cellulitis no evidence of osteomyelitis.  Antibiotics were started  Hospital course: Patient admitted to [facility] for further eval --seen by orthopedic surgery who recommend ABI studies with possible vascular consult depending on results.Dressing changes per W OC    Subjective: Seen by vascular surgery and underwent angiogram today.  Tolerated procedure well.  On bedrest for 4 hours post procedure.   Objective: Vitals:   03/26/19 1500 03/26/19 1515 03/26/19 1530 03/26/19 1545  BP: (!) 101/52 (!) 157/64    Pulse: 62 63 65 64  Resp: (!) '22 18 18 18  ' Temp:      TempSrc:      SpO2: 100% 100% 100% 100%  Weight:      Height:        Intake/Output Summary (Last 24 hours) at 03/26/2019 1839 Last data filed at 03/26/2019 0900 Gross per 24 hour  Intake 0 ml  Output 900 ml  Net -900 ml   Filed Weights   03/23/19 2128  Weight: 40.8 kg    Physical Examination:  General exam: Appears calm and comfortable  Respiratory system: Clear to auscultation. Respiratory effort normal. Cardiovascular system: S1 & S2 heard, RRR. No JVD, murmurs, rubs, gallops or clicks. No pedal edema. Gastrointestinal system: Abdomen is nondistended, soft and nontender. Normal bowel sounds heard. Central nervous system: Alert and  oriented. No new focal neurological deficits. Extremities: No contractures, edema or joint deformities.  Skin: Large open wound/ulceration on anterolateral aspect of right lower extremity as shown below Psychiatry: Judgement and insight appear normal. Mood & affect appropriate.     Data Reviewed: I have personally reviewed following labs and imaging studies  CBC: Recent Labs  Lab 03/23/19 2140 03/26/19 0541  WBC 5.3 4.1  NEUTROABS 3.9  --   HGB 10.1* 11.2*  HCT 33.6* 35.2*  MCV 96.6 93.4  PLT 221 759   Basic Metabolic Panel: Recent Labs  Lab 03/23/19 2140 03/25/19 0255 03/26/19 0541  NA 146* 140 141  K 3.7 4.1 4.1  CL 107 108 102  CO2 29 28 32  GLUCOSE 87 90 85  BUN 34* 30* 20  CREATININE 0.67 0.70 0.67  CALCIUM 9.0 7.9* 8.3*   GFR: Estimated Creatinine Clearance: 35.5 mL/min (by C-G formula based on SCr of 0.67 mg/dL). Liver Function Tests: Recent Labs  Lab 03/23/19 2140  AST 24  ALT 23  ALKPHOS 91  BILITOT 0.3  PROT 6.3*  ALBUMIN 3.6   No results for input(s): LIPASE, AMYLASE in the last 168 hours. No results for input(s): AMMONIA in the last 168 hours. Coagulation Profile: No results for input(s): INR, PROTIME in the last 168 hours. Cardiac Enzymes: No results for input(s): CKTOTAL, CKMB, CKMBINDEX, TROPONINI in the last 168 hours. BNP (last 3 results) No results for input(s): PROBNP in the last 8760 hours. HbA1C: No results for input(s):  HGBA1C in the last 72 hours. CBG: No results for input(s): GLUCAP in the last 168 hours. Lipid Profile: No results for input(s): CHOL, HDL, LDLCALC, TRIG, CHOLHDL, LDLDIRECT in the last 72 hours. Thyroid Function Tests: Recent Labs    03/24/19 0402  TSH 7.483*   Anemia Panel: Recent Labs    03/24/19 0402  VITAMINB12 586  FOLATE 26.7  FERRITIN 93  TIBC 244*  IRON 46  RETICCTPCT 1.0   Sepsis Labs: Recent Labs  Lab 03/23/19 2140  LATICACIDVEN 1.1    Recent Results (from the past 240 hour(s))    SARS Coronavirus 2 by RT PCR     Status: None   Collection Time: 03/24/19  2:27 AM  Result Value Ref Range Status   SARS Coronavirus 2 NEGATIVE NEGATIVE Final    Comment: (NOTE) Result indicates the ABSENCE of SARS-CoV-2 RNA in the patient specimen.  The lowest concentration of SARS-CoV-2 viral copies this assay can detect in nasopharyngeal swab specimens is 500 copies / mL.  A negative result does not preclude SARS-CoV-2 infection and should not be used as the sole basis for patient management decisions. A negative result may occur with improper specimen collection / handling, submission of a specimen other than nasopharyngeal swab, presence of viral mutation(s) within the areas targeted by this assay, and inadequate number of viral copies (<500 copies / mL) present.  Negative results must be combined with clinical observations, patient history, and epidemiological information.  The expected result is NEGATIVE.  Patient Fact Sheet:  BlogSelections.co.uk   Provider Fact Sheet:  https://lucas.com/   This test is not yet approved or cleared by the [place] FDA and  has been authorized for  detection and/or diagnosis of SARS-CoV-2 by FDA under an Emergency Use Authorization (EUA).  This EUA will remain in effect (meaning this test can be used) for the duration of  the COVID-19 declaration under Section 564(b)(1) of the Act, 21 U.S.C. section 360bbb-3(b)(1), unless the authorization is terminated or revoked sooner Performed at [facility], [place] [address], [place], [place] [address]   Culture, Urine     Status: Abnormal   Collection Time: 03/24/19  4:47 PM   Specimen: Urine, Random  Result Value Ref Range Status   Specimen Description URINE, RANDOM  Final   Special Requests NONE  Final   Culture (A)  Final    <10,000 COLONIES/mL INSIGNIFICANT GROWTH Performed at [facility], [place] [address] [place], [place]  [address]    Report Status 03/25/2019 FINAL  Final      Radiology Studies: PERIPHERAL VASCULAR CATHETERIZATION  Result Date: 03/26/2019 Procedure: Abdominal aortogram with bilateral lower extremity runoff, angioplasty right superficial femoral artery (6 x 40 drug-coated balloon) Preoperative diagnosis: Nonhealing wound right leg Postoperative diagnosis: Same Anesthesia: Local Operative findings: Focal right SFA stenosis greater than 90% angioplastied to 0% residual stenosis with drug-eluting balloon Operative details: After team informed consent, patient taken to the [place] lab.  She was placed in supine position on the angio table.  Both groins were prepped and draped in usual sterile fashion.  Local anesthesia was demonstrated over the left common femoral artery.  Using ultrasound guidance an introducer needle was used to cannulate the left common femoral artery and an 035 versa core wire threaded up the abdominal aorta under fluoroscopic guidance.  5 French sheath placed over the guidewire in the left common femoral artery.  This was thoroughly flushed heparinized saline.  A 5 French pigtail catheter was then  advanced up in the abdominal aorta and abdominal aortogram obtained in AP projection.  Left and right renal arteries are patent.  Infrarenal abdominal aorta is patent.  Left and right common internal/external iliac arteries are all widely patent with no focal stenosis. At this point bilateral lower extremity runoff views were obtained through the pigtail catheter. In the right lower extremity, the right common femoral profunda femoris artery widely patent.  Right superficial femoral artery is patent except for a focal greater than 90% stenosis at the level of the adductor hiatus which is about 2 mm in length.  The remainder of the popliteal artery is patent.  There is three-vessel runoff to the right foot.  There is a blush of contrast adjacent to the ulcer on the leg perfused by collaterals. In the left  lower extremity, there are similar findings although the stenosis at the adductor hiatus is slightly less than on the right. At this point was decided to intervene on the right SFA stenosis.  Pigtail catheter was removed over guidewire and exchanged for a 5 [place] crossover catheter.  This was used selectively catheterize the right common iliac artery and then I advanced an 035 angled Glidewire down into the mid right SFA.  This was then swapped out for a versa core wire and a 6 [place] long sheath advanced up and over the aortic bifurcation and into the right superficial femoral artery.  Patient was given a total of 7000 units of intravenous heparin.  She was initially given a 5000 bolus and ACT was in the 190 range.  ACT was not repeated after the additional 2000 units of heparin.  At this point a V 18 wire was placed with a quick cross catheter and I was able to advance the V 18 wire easily across the lesion.  It was then predilated with a 640 Mustang balloon and then after 1 minute of predilation if this was swapped out for a 640 Ranger drug-eluting balloon and this was inflated and centered on the lesion for 3 minutes.  This was done at 8 atm for both inflations.  Completion angiogram showed no evidence of dissection widely patent SFA intact three-vessel runoff.  At this point the guidewires were removed and the sheath swapped out for a short sheath over a versa core wire.  This was thoroughly flushed heparinized saline.  The patient tolerated procedure well and there were no complications.  Patient was taken the holding area in stable condition. Ruta Hinds, MD [facility] Office: [phone]        Scheduled Meds: . aspirin EC  81 mg Oral Daily  . [START ON 03/27/2019] clopidogrel  75 mg Oral Q breakfast  . enoxaparin (LOVENOX) injection  30 mg Subcutaneous Q24H  . feeding supplement (PRO-STAT SUGAR FREE 64)  30 mL Oral Daily  . levothyroxine  88 mcg Oral Q0600   . multivitamin with minerals  1 tablet Oral Daily  . rosuvastatin  10 mg Oral q1800  . sodium chloride flush  3 mL Intravenous Q12H   Continuous Infusions: . sodium chloride 100 mL/hr at 03/26/19 1748  . sodium chloride    . cefTRIAXone (ROCEPHIN)  IV 1 g (03/26/19 0410)   Assessment/Plan: #1.  Cellulitis/infected leg wound, possibly ischemic ulcer right calf.   X-ray of right tibia/fibula shows soft tissue ulceration and soft tissue swelling consistent with cellulitis, no evidence of osteomyelitis.  She is afebrile, hemodynamically stable, no leukocytosis and nontoxic-appearing.Seen by orthopedic surgery for  possible debridement, who recommend ABI studies which showed  moderate right lower extremity arterial disease and mild left lower extremity arterial disease.  Seen by vascular surgery and patient underwent aortogram and lower extremity arteriogram/angioplasty for focal right SFA stenosis greater than 90%.  Plavix, aspirin and statins added to medication regimen.  She is advised to continue Plavix at least for 6 weeks.  Continue IV antibiotics, orthopedics to follow-up in a.m. and suggest further course of action.  #2.  Pyuria: Asymptomatic.  Urine culture with insignificant growth.  UTI ruled out.  #3.  Chronic normocytic anemia: Hemoglobin 10.2.  Chart review indicates hemoglobin 10.6 in 2018.  Anemia panel reveals TIBC 244 otherwise levels within the limits of normal.  No sign symptoms of active bleeding.-Monitor  #4.  Hypothyroidism. Patient has h/o hyperthyroidism s/o thyroidectomy but states was recently told she ?might have recurrence. On Synthroid--will likely need dose adjustment /discontinuation based on full thyroid profile (ordered for am)   #5.  Borderline hypernatremia. Sodium level 146 on admission labs, improved with IV hydration and oral intake   DVT prophylaxis: Add Lovenox Code Status: Full code Family / Patient Communication: Discussed with patient Disposition  Plan:   Patient is from home prior to hospitalization. Received/Receiving inpatient care for cellulitis/infected leg wound with IV antibiotics. Discharge when cleared by Ortho/vascular eval completed and transition to oral antibiotics.   Guilford Shi, MD [facility] Pager in [place]  If 7PM-7AM, please contact night-coverage [email or web address] 03/26/2019, 6:39 PM

## 2019-03-26 NOTE — Progress Notes (Addendum)
Pt is A&O x3. She signed consent for Abdominal angiogram. I called her spouse and is aware of the procedure. Kept pt NPO post midnight.  0950 Pt to cath lab. Pt's belongings were sent to cath lab with the pt.

## 2019-03-26 NOTE — Progress Notes (Signed)
Pt experiencing lower left abdominal pain and tenderness.  Pt's abdomen was distended before procedure.  Dr Oneida Alar made aware of pt's complaints.  Receiving nurse also made aware of pt's issues, and will monitor the pt for any changes.

## 2019-03-26 NOTE — Op Note (Addendum)
Procedure: Abdominal aortogram with bilateral lower extremity runoff, angioplasty right superficial femoral artery (6 x 40 drug-coated balloon)  Preoperative diagnosis: Nonhealing wound right leg  Postoperative diagnosis: Same  Anesthesia: Local  Operative findings: Focal right SFA stenosis greater than 90% angioplastied to 0% residual stenosis with drug-eluting balloon  Operative details: After team informed consent, patient taken to the [facility] lab.  She was placed in supine position on the angio table.  Both groins were prepped and draped in usual sterile fashion.  Local anesthesia was demonstrated over the left common femoral artery.  Using ultrasound guidance an introducer needle was used to cannulate the left common femoral artery and an 035 versa core wire threaded up the abdominal aorta under fluoroscopic guidance.  5 French sheath placed over the guidewire in the left common femoral artery.  This was thoroughly flushed heparinized saline.  A 5 French pigtail catheter was then advanced up in the abdominal aorta and abdominal aortogram obtained in AP projection.  Left and right renal arteries are patent.  Infrarenal abdominal aorta is patent.  Left and right common internal/external iliac arteries are all widely patent with no focal stenosis.  At this point bilateral lower extremity runoff views were obtained through the pigtail catheter.  In the right lower extremity, the right common femoral profunda femoris artery widely patent.  Right superficial femoral artery is patent except for a focal greater than 90% stenosis at the level of the adductor hiatus which is about 2 mm in length.  The remainder of the popliteal artery is patent.  There is three-vessel runoff to the right foot.  There is a blush of contrast adjacent to the ulcer on the leg perfused by collaterals.  In the left lower extremity, there are similar findings although the stenosis at the adductor hiatus is slightly less than on the  right.  At this point was decided to intervene on the right SFA stenosis.  Pigtail catheter was removed over guidewire and exchanged for a 5 [place] crossover catheter.  This was used selectively catheterize the right common iliac artery and then I advanced an 035 angled Glidewire down into the mid right SFA.  This was then swapped out for a versa core wire and a 6 [place] long sheath advanced up and over the aortic bifurcation and into the right superficial femoral artery.  Patient was given a total of 7000 units of intravenous heparin.  She was initially given a 5000 bolus and ACT was in the 190 range.  ACT was not repeated after the additional 2000 units of heparin.  At this point a V 18 wire was placed with a quick cross catheter and I was able to advance the V 18 wire easily across the lesion.  It was then predilated with a 640 Mustang balloon and then after 1 minute of predilation if this was swapped out for a 640 Ranger drug-eluting balloon and this was inflated and centered on the lesion for 3 minutes.  This was done at 8 atm for both inflations.  Completion angiogram showed no evidence of dissection widely patent SFA intact three-vessel runoff.  At this point the guidewires were removed and the sheath swapped out for a short sheath over a versa core wire.  This was thoroughly flushed heparinized saline.  The patient tolerated procedure well and there were no complications.  Patient was taken the holding area in stable condition.  The patient will be placed on Plavix aspirin and Crestor.  Plavix can be stopped in  6 weeks if she has an intolerance to this.  We will arrange a follow-up visit for her in 3 to 4 weeks for repeat duplex and ABIs.  Ruta Hinds, MD [facility] [place] Office: [phone]

## 2019-03-26 NOTE — Progress Notes (Signed)
6Fr left femoral arterial sheath aspirated and pulled.  Manual pressure held for 58mins.  Hemostasis achieved, site level 0. Instructions given to pt., pt verbalizes understanding.  Tegaderm dressing applied to site.    Bedrest begins 1520 for 4 hours.

## 2019-03-27 LAB — T4, FREE: Free T4: 0.8 ng/dL (ref 0.61–1.12)

## 2019-03-27 LAB — TSH: TSH: 14.683 u[IU]/mL — ABNORMAL HIGH (ref 0.350–4.500)

## 2019-03-27 MED ORDER — CEPHALEXIN 500 MG PO CAPS
500.0000 mg | ORAL_CAPSULE | Freq: Two times a day (BID) | ORAL | Status: DC
  Administered 2019-03-27 – 2019-03-28 (×2): 500 mg via ORAL
  Filled 2019-03-27 (×2): qty 1

## 2019-03-27 NOTE — Evaluation (Signed)
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  X-ray of right tib-fib = soft tissue ulceration lateral aspect?  Cellulitis started vancomycin.  Orthopedic consulted recommended ABIs and vascular consult underwent angiogram 2/26.     Clinical Impression   This 84 y/o female presents with the above. PTA pt was performing ADL and mobility independently. Pt with some confusion this session and spouse assisting to provide home setup/PLOF. Pt currently requiring minA for functional transfers (HHA) and LB ADL. Pt requiring cues for safety with transitions but overall tolerating well. Spouse supportive and reports plans to assist with ADL/mobility PRN after discharge. Pt will benefit from continued acute OT services and recommend follow up [place] services to progress pt towards her PLOF. Will follow.     Follow Up Recommendations  Home health OT;Supervision/Assistance - 24 hour    Equipment Recommendations  None recommended by OT           Precautions / Restrictions Precautions Precautions: Fall Restrictions Weight Bearing Restrictions: No      Mobility Bed Mobility Overal bed mobility: Needs Assistance Bed Mobility: Sit to Supine       Sit to supine: Min guard   General bed mobility comments: for safety  Transfers Overall transfer level: Needs assistance Equipment used: 1 person hand held assist Transfers: Sit to/from [facility] Sit to Stand: Min assist Stand pivot transfers: Min assist       General transfer comment: boosting and steadying assist without RW, pt reliant on UE support/external assist    Balance Overall balance assessment: Needs  assistance Sitting-balance support: Feet supported Sitting balance-Leahy Scale: Fair     Standing balance support: Single extremity supported Standing balance-Leahy Scale: Poor Standing balance comment: reliant on UE support/external assist                           ADL either performed or assessed with clinical judgement   ADL Overall ADL's : Needs assistance/impaired Eating/Feeding: Set up;Sitting   Grooming: Set up;Min guard;Sitting   Upper Body Bathing: Set up;Min guard;Sitting   Lower Body Bathing: Minimal assistance;Sit to/from stand   Upper Body Dressing : Min guard;Set up;Sitting   Lower Body Dressing: Minimal assistance;Sit to/from stand   Toilet Transfer: Minimal Scientist, forensic Details (indicate cue type and reason): simulated via transfer recliner to [place] and Hygiene: Minimal assistance;Sit to/from stand       Functional mobility during ADLs: Minimal assistance(HHA)                           Pertinent Vitals/Pain Pain Assessment: Faces Faces Pain Scale: Hurts little more Pain Location: right LE Pain Descriptors / Indicators: Aching;Grimacing;Guarding Pain Intervention(s): Limited activity within patient's tolerance;Monitored during session;Repositioned     Hand Dominance Right   Extremity/Trunk Assessment Upper Extremity Assessment Upper Extremity Assessment: Generalized weakness   Lower Extremity Assessment Lower Extremity Assessment: Defer to PT evaluation RLE: Unable to fully assess due to pain   Cervical / Trunk Assessment Cervical / Trunk Assessment: Kyphotic   Communication Communication Communication: No difficulties   Cognition Arousal/Alertness: Awake/alert Behavior During Therapy: Flat affect Overall Cognitive Status: History of cognitive impairments - at baseline  General Comments: Dementia and memory problems per  husband and chart   General Comments  VSS, issued gait belt to spouse and educated in use    Exercises     Shoulder Instructions      Home Living Family/patient expects to be discharged to:: Private residence Living Arrangements: Spouse/significant other Available Help at Discharge: Family;Available 24 hours/day Type of Home: House Home Access: Stairs to enter [employer] of Steps: 2 Entrance Stairs-Rails: None Home Layout: One level     Bathroom Shower/Tub: Occupational psychologist: Standard     Home Equipment: Environmental consultant - 4 wheels;Cane - single point;Shower seat - built in;Grab bars - tub/shower   Additional Comments: They live on the other side of [place] and are heading back in the am per husband      Prior Functioning/Environment Level of Independence: Independent                 OT Problem List: Decreased strength;Decreased activity tolerance;Impaired balance (sitting and/or standing);Decreased cognition;Decreased knowledge of use of DME or AE;Pain;Decreased safety awareness      OT Treatment/Interventions: Self-care/ADL training;Therapeutic exercise;Energy conservation;DME and/or AE instruction;Cognitive remediation/compensation;Therapeutic activities;Patient/family education;Balance training    OT Goals(Current goals can be found in the care plan section) Acute Rehab OT Goals Patient Stated Goal: to go home OT Goal Formulation: With patient Time For Goal Achievement: 04/10/19 Potential to Achieve Goals: Good  OT Frequency: Min 2X/week   Barriers to D/C:            Co-evaluation              AM-PAC OT "6 Clicks" Daily Activity     Outcome Measure Help from another person eating meals?: None Help from another person taking care of personal grooming?: A Little Help from another person toileting, which includes using toliet, bedpan, or urinal?: A Little Help from another person bathing (including washing, rinsing,  drying)?: A Little Help from another person to put on and taking off regular upper body clothing?: None Help from another person to put on and taking off regular lower body clothing?: A Little 6 Click Score: 20   End of Session Equipment Utilized During Treatment: Gait belt Nurse Communication: Mobility status  Activity Tolerance: Patient tolerated treatment well Patient left: in bed;with call bell/phone within reach;with bed alarm set  OT Visit Diagnosis: Muscle weakness (generalized) (M62.81);Unsteadiness on feet (R26.81)                Time: [id number] OT Time Calculation (min): 16 min Charges:  OT General Charges $OT Visit: 1 Visit OT Evaluation $OT Eval Moderate Complexity: 1 Mod  Lou Cal, OT [employer] Pager [phone] Office [phone]   Raymondo Band 03/27/2019, 4:38 PM

## 2019-03-27 NOTE — Progress Notes (Signed)
Hospitalist progress note   Patient from home, Patient going unclear unclear, Dispo likely home once OOB and no fever and no new issues  Autumn Sullivan [id number] DOB: 06-Jul-1935 DOA: 03/23/2019  PCP: No primary care provider on file.   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  Cellulitis started vancomycin Orthopedic consulted recommended ABIs and vascular consult underwent angiogram 2/26  Data Reviewed:  TSH 14.6 Hemoglobin 11.2 WBC 4.1 platelet 196  Assessment & Plan:  Right lower extremity wound VVS did Angio and Angioplasited focal R SFA with drug eluting balloon--needs ASA plavix and statin going forward Continue Rocephin today--liekly narrow to orals as per surgeons, complete ~ 7-10 days? Minimize IV morphine  given confusion overnight may use oral opiates-try to restrict Haldol Nursing instructed to get out of bed to chair Follicular adenoma of thyroid with thyroidectomy and clinical hypothyroidism TSH elevated-will start low dose synbtrhoid 88 mcg--recheck in about 3 weeks AKI on admission Encourage p.o. intake cut back saline to 50 cc an hour down from 100 and repeat labs a.m. Mast cell disease Fecal/urinary incontinence status post corrective surgery 12/2018 Seems stable at this time Memory loss?  Dementia Unable to orient to place person-note agitation overnight We will discuss with family going forward plan of care Prior Lyme disease History of Normocytic anemia Stable Asymptomatic bacteriuria Antibiotics that she is on for her right lower extremity wound should treat  Discussed with husband 2/27 and  updated completely  Subjective: Confused cannot get coherent history Can tell me that her husband rates this is oriented to place or person States she has some pain  Consultants:   Vascular  Objective: Vitals:   03/26/19 1616 03/26/19 1800 03/26/19 1914 03/27/19 0351  BP: (!) 143/64 133/66 (!) 146/63 (!) 130/54  Pulse:    69  Resp: 12 12 14 17   Temp:   98.4 F (36.9 C) 97.7 F (36.5 C)  TempSrc:   Oral Axillary  SpO2: 100% 100% 100% 99%  Weight:    39.4 kg  Height:        Intake/Output Summary (Last 24 hours) at 03/27/2019 0745 Last data filed at 03/27/2019 0300 Gross per 24 hour  Intake 1081.01 ml  Output 600 ml  Net 481.01 ml   Filed Weights   03/23/19 2128 03/27/19 0351  Weight: 40.8 kg 39.4 kg    Examination:  Awake pleasant but confused [id number] holosystolic murmur across precordium Chest clear no added sound Very frail asthenic Abdomen soft nontender No lower extremity edema Wound not examined today   Scheduled Meds: . aspirin EC  81 mg Oral Daily  . clopidogrel  75 mg Oral Q breakfast  . enoxaparin (LOVENOX) injection  30 mg Subcutaneous Q24H  . feeding supplement (PRO-STAT SUGAR FREE 64)  30 mL Oral Daily  . levothyroxine  88 mcg Oral Q0600  . multivitamin with minerals  1 tablet Oral Daily  . rosuvastatin  10 mg Oral q1800  . sodium chloride flush  3 mL Intravenous Q12H   Continuous Infusions: . sodium chloride 100 mL/hr at 03/26/19 1748  . sodium chloride    . cefTRIAXone (ROCEPHIN)  IV 1 g (03/27/19 0338)  LOS: 3 days   Time spent: [place], MD [facility]  03/27/2019, 7:45 AM

## 2019-03-27 NOTE — Evaluation (Signed)
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  X-ray of right tib-fib = soft tissue ulceration lateral aspect?  Cellulitis started vancomycin.  Orthopedic consulted recommended ABIs and vascular consult underwent angiogram 2/26.    Clinical Impression  Pt admitted with above diagnosis. Pt was able to ambulate in room with min assist of 1 person with HHA which is what husband is comfortable with. Issued gait belt for husband to use to hold onto pt.  Husband states he feels ready to drive her back to their home and they have equipment they need. Husband and pt do agree to [place] in the home once they are back.   Pt currently with functional limitations due to the deficits listed below (see PT Problem List). Pt will benefit from skilled PT to increase their independence and safety with mobility to allow discharge to the venue listed below.      Follow Up Recommendations Home health PT;Supervision/Assistance - 24 hour    Equipment Recommendations  Other (comment)(gait belt issued)    Recommendations for Other Services       Precautions / Restrictions Precautions Precautions: Fall Restrictions Weight Bearing Restrictions: No      Mobility  Bed Mobility               General bed mobility comments: in chair on arrival  Transfers Overall transfer level: Needs assistance Equipment used: Rolling walker (2 wheeled) Transfers: Sit to/from [facility] Sit to Stand: Min assist Stand pivot transfers: Min assist       General transfer comment: Pt needs min assist without device as she is dependent on holding onto something with at least one hand.  When she stood to RW  she was steady upon reaching for walker. However they have a rollator at home and do not want another one even for trip. Husband states he will be with her all the time. Issued gait belt.   Ambulation/Gait Ambulation/Gait assistance: Min assist;Min guard Gait Distance (Feet): 50 Feet Assistive device: Rolling walker (2 wheeled);1 person hand held assist;None Gait Pattern/deviations: Step-to pattern;Decreased step length - right;Decreased step length - left;Decreased stride length;Shuffle;Trunk flexed;Drifts right/left;Staggering left;Staggering right   Gait velocity interpretation: <1.31 ft/sec, indicative of household ambulator General Gait Details: Pt with definite need of at least 1 UE support when standing.  Pt can walk without device but staggers.  WIith HHA, needs min assist for stability and gait belt.  Min guard asssist with RW and steadier but they do not want to get a RW here because they have one at home per pt and husband.   Stairs            Wheelchair Mobility    Modified Rankin (Stroke Patients Only)       Balance Overall balance assessment: Needs assistance         Standing balance support: Bilateral upper extremity supported;No upper extremity supported;During functional activity Standing balance-Leahy Scale: Poor Standing balance comment: relies on UE suppport for balance.  Needed assist to clean BM as she had a smear on initialy standing.                              Pertinent Vitals/Pain Pain  Assessment: Faces Faces Pain Scale: Hurts little more Pain Location: right LE Pain Descriptors / Indicators: Aching;Grimacing;Guarding Pain Intervention(s): Limited activity within patient's tolerance;Monitored during session;Repositioned    Home Living Family/patient expects to be discharged to:: Private residence Living Arrangements: Spouse/significant other Available Help at Discharge: Family;Available 24 hours/day Type of Home: House Home  Access: Stairs to enter Entrance Stairs-Rails: None Entrance Stairs-Number of Steps: 2 Home Layout: One level Home Equipment: Walker - 4 wheels;Cane - single point;Shower seat - built in;Grab bars - tub/shower Additional Comments: They live on the other side of [place] and are heading back in the am per husband    Prior Function Level of Independence: Independent               Hand Dominance   Dominant Hand: Right    Extremity/Trunk Assessment   Upper Extremity Assessment Upper Extremity Assessment: Defer to OT evaluation    Lower Extremity Assessment Lower Extremity Assessment: Overall WFL for tasks assessed;RLE deficits/detail RLE: Unable to fully assess due to pain    Cervical / Trunk Assessment Cervical / Trunk Assessment: Kyphotic  Communication   Communication: No difficulties  Cognition Arousal/Alertness: Awake/alert Behavior During Therapy: Flat affect Overall Cognitive Status: History of cognitive impairments - at baseline                                 General Comments: Dementia and memory problems per husband and chart      General Comments General comments (skin integrity, edema, etc.): VSS.      Exercises     Assessment/Plan    PT Assessment Patient needs continued PT services  PT Problem List Decreased activity tolerance;Decreased balance;Decreased mobility;Decreased knowledge of use of DME;Decreased safety awareness;Decreased cognition;Decreased knowledge of precautions       PT Treatment Interventions DME instruction;Gait training;Therapeutic activities;Therapeutic exercise;Balance training;Stair training;Functional mobility training;Patient/family education    PT Goals (Current goals can be found in the Care Plan section)  Acute Rehab PT Goals Patient Stated Goal: to go home PT Goal Formulation: With patient Time For Goal Achievement: 04/10/19 Potential to Achieve Goals: Good    Frequency Min 3X/week   Barriers  to discharge        Co-evaluation               AM-PAC PT "6 Clicks" Mobility  Outcome Measure Help needed turning from your back to your side while in a flat bed without using bedrails?: A Little Help needed moving from lying on your back to sitting on the side of a flat bed without using bedrails?: A Little Help needed moving to and from a bed to a chair (including a wheelchair)?: A Little Help needed standing up from a chair using your arms (e.g., wheelchair or bedside chair)?: A Little Help needed to walk in hospital room?: A Little Help needed climbing 3-5 steps with a railing? : A Little 6 Click Score: 18    End of Session Equipment Utilized During Treatment: Gait belt Activity Tolerance: Patient limited by fatigue Patient left: in chair;with call bell/phone within reach;with chair alarm set(OT came in as PT finishing up session) Nurse Communication: Mobility status PT Visit Diagnosis: Unsteadiness on feet (R26.81);Muscle weakness (generalized) (M62.81)    Time: 1445-1520 PT Time Calculation (min) (ACUTE ONLY): 35 min   Charges:   PT Evaluation $PT Eval Moderate Complexity: 1 Mod PT Treatments $Gait Training: 8-22 mins  Hussein Macdougal W,PT Acute Rehabilitation Services Pager:  [phone]  Office:  [place] 03/27/2019, 3:46 PM

## 2019-03-27 NOTE — Progress Notes (Signed)
Patient became very disoriented to place and time; also became very paranoid about her surroundings. Attempted to reorient patient, with little success. Patient has attempted to to get out of bed multiple times. On-call MD for [facility] was paged, orders received for Haldol & administered. Will continue to monitor.   Elaina Hoops, RN

## 2019-03-27 NOTE — Progress Notes (Signed)
  Progress Note    03/27/2019 9:44 AM 1 Day Post-Op  Subjective: Denies overnight issues  Vitals:   03/26/19 1914 03/27/19 0351  BP: (!) 146/63 (!) 130/54  Pulse:  69  Resp: 14 17  Temp: 98.4 F (36.9 C) 97.7 F (36.5 C)  SpO2: 100% 99%    Physical Exam: Awake and alert None lab respirations Left groin access site soft no hematoma Right lower extremity dressing clean dry intact Palpable right popliteal pulse  CBC    Component Value Date/Time   WBC 4.1 03/26/2019 0541   RBC 3.77 (L) 03/26/2019 0541   HGB 11.2 (L) 03/26/2019 0541   HCT 35.2 (L) 03/26/2019 0541   PLT 196 03/26/2019 0541   MCV 93.4 03/26/2019 0541   MCH 29.7 03/26/2019 0541   MCHC 31.8 03/26/2019 0541   RDW 16.0 (H) 03/26/2019 0541   LYMPHSABS 0.7 03/23/2019 2140   MONOABS 0.5 03/23/2019 2140   EOSABS 0.1 03/23/2019 2140   BASOSABS 0.0 03/23/2019 2140    BMET    Component Value Date/Time   NA 141 03/26/2019 0541   K 4.1 03/26/2019 0541   CL 102 03/26/2019 0541   CO2 32 03/26/2019 0541   GLUCOSE 85 03/26/2019 0541   BUN 20 03/26/2019 0541   CREATININE 0.67 03/26/2019 0541   CALCIUM 8.3 (L) 03/26/2019 0541   GFRNONAA >60 03/26/2019 0541   GFRAA >60 03/26/2019 0541    INR No results found for: INR   Intake/Output Summary (Last 24 hours) at 03/27/2019 0944 Last data filed at 03/27/2019 0300 Gross per 24 hour  Intake 1081.01 ml  Output 200 ml  Net 881.01 ml     Assessment/plan:  84 y.o. female is status post drug-coated balloon angioplasty right SFA now with popliteal pulse that is palpable.  Hopefully this will heal her wound.  Needs aspirin, Plavix and statin on discharge  Vascular will be available as needed.   Leonel Mccollum C. Donzetta Matters, MD [facility] Office: [phone] Pager: [phone]  03/27/2019 9:44 AM

## 2019-03-28 LAB — COMPREHENSIVE METABOLIC PANEL
ALT: 14 U/L (ref 0–44)
AST: 18 U/L (ref 15–41)
Albumin: 2.9 g/dL — ABNORMAL LOW (ref 3.5–5.0)
Alkaline Phosphatase: 71 U/L (ref 38–126)
Anion gap: 7 (ref 5–15)
BUN: 12 mg/dL (ref 8–23)
CO2: 29 mmol/L (ref 22–32)
Calcium: 8.1 mg/dL — ABNORMAL LOW (ref 8.9–10.3)
Chloride: 106 mmol/L (ref 98–111)
Creatinine, Ser: 0.54 mg/dL (ref 0.44–1.00)
GFR calc Af Amer: >60 mL/min (ref >60)
GFR calc non Af Amer: >60 mL/min (ref >60)
Glucose, Bld: 92 mg/dL (ref 70–99)
Potassium: 4 mmol/L (ref 3.5–5.1)
Sodium: 142 mmol/L (ref 135–145)
Total Bilirubin: 0.8 mg/dL (ref 0.3–1.2)
Total Protein: 5.6 g/dL — ABNORMAL LOW (ref 6.5–8.1)

## 2019-03-28 LAB — CBC WITH DIFFERENTIAL/PLATELET
Abs Immature Granulocytes: 0.01 10*3/uL (ref 0.00–0.07)
Basophils Absolute: 0 10*3/uL (ref 0.0–0.1)
Basophils Relative: 0 %
Eosinophils Absolute: 0.1 10*3/uL (ref 0.0–0.5)
Eosinophils Relative: 3 %
HCT: 31.5 % — ABNORMAL LOW (ref 36.0–46.0)
Hemoglobin: 9.7 g/dL — ABNORMAL LOW (ref 12.0–15.0)
Immature Granulocytes: 0 %
Lymphocytes Relative: 10 %
Lymphs Abs: 0.5 10*3/uL — ABNORMAL LOW (ref 0.7–4.0)
MCH: 29 pg (ref 26.0–34.0)
MCHC: 30.8 g/dL (ref 30.0–36.0)
MCV: 94.3 fL (ref 80.0–100.0)
Monocytes Absolute: 0.5 10*3/uL (ref 0.1–1.0)
Monocytes Relative: 11 %
Neutro Abs: 3.9 10*3/uL (ref 1.7–7.7)
Neutrophils Relative %: 76 %
Platelets: 205 10*3/uL (ref 150–400)
RBC: 3.34 MIL/uL — ABNORMAL LOW (ref 3.87–5.11)
RDW: 16.2 % — ABNORMAL HIGH (ref 11.5–15.5)
WBC: 5.1 10*3/uL (ref 4.0–10.5)
nRBC: 0 % (ref 0.0–0.2)

## 2019-03-28 MED ORDER — CEPHALEXIN 500 MG PO CAPS: 500.0000 mg | ORAL_CAPSULE | Freq: Two times a day (BID) | ORAL | 0 refills | Status: AC

## 2019-03-28 MED ORDER — CLOPIDOGREL BISULFATE 75 MG PO TABS: 75.0000 mg | ORAL_TABLET | Freq: Every day | ORAL | 12 refills | Status: AC

## 2019-03-28 MED ORDER — ACETAMINOPHEN 325 MG PO TABS: 650.0000 mg | ORAL_TABLET | Freq: Four times a day (QID) | ORAL | Status: AC | PRN

## 2019-03-28 MED ORDER — ASPIRIN 81 MG PO TBEC: 81.0000 mg | DELAYED_RELEASE_TABLET | Freq: Every day | ORAL | Status: AC

## 2019-03-28 MED ORDER — ROSUVASTATIN CALCIUM 10 MG PO TABS: 10.0000 mg | ORAL_TABLET | Freq: Every day | ORAL | 3 refills | Status: AC

## 2019-03-28 MED ORDER — PRO-STAT SUGAR FREE PO LIQD: 30.0000 mL | Freq: Every day | ORAL | 0 refills | Status: AC

## 2019-03-28 NOTE — Care Management (Signed)
Spoke to patient regarding discharge plans this am prior to her d/c.  Patient states she and her husband were here visiting sister and they plan to drive back home to [place] tomorrow.  She is agreeable to [facility] PT/RN and states she has had an agency in the past but wants a different agency.  She does not recall the agency she used before.    Per Medicare list by zip code, 3 agencies are available. Additional agencies called as follows:   [facility] states they do not serve that area and suggest [facility] or [facility].   Neither [facility] or [place] serve that area per [employer], Building surveyor and [employer]    [facility] states they do serve patient's area, but start of care will be end of week and they are not able to accept patient today.  The rep states if they are contacted tomorrow or later in the week, they may be able to accept referral but need administrator support (M-F).      [facility] -[phone]- did not return phone calls.  CM called twice.    Discussed with patient's husband at 55.  He will f/u with PMD office and agency they previously used, but requests that CM calls back to [facility].  Patient's husband's number is best to reach them. He is given the [facility] office number to reach [place] tomorrow as needed.  Will ask supervisor to f/u tomorrow.

## 2019-03-28 NOTE — Discharge Summary (Signed)
Physician Discharge Summary  Autumn Sullivan ZPH:[id number] DOB: December 29, 1935 DOA: 03/23/2019  PCP: No primary care provider on file.  Admit date: 03/23/2019 Discharge date: 03/28/2019  Time spent: 45 minutes  Recommendations for Outpatient Follow-up:  1. We will need to complete course of antibiotics for lower extremity wound in about 10 days on 04/06/2019 2. Outpatient TSH in about 3 weeks 3. CBC differential as well as Chem-12 in about 1 week 4. Wound checks and wound dressings as per nursing-Vaseline gauze in the wound bed to help with gauze secure with Kerlix and daily changes   Discharge Diagnoses:  Principal Problem:   Cellulitis Active Problems:   UTI (urinary tract infection)   Anemia   Hypothyroidism   Wound of skin   Ischemic ulcer of right calf, limited to breakdown of skin (HCC)   PVD (peripheral vascular disease) (HCC)   Hypernatremia   Malnutrition of moderate degree   Discharge Condition: Improved  Diet recommendation: hh  Filed Weights   03/23/19 2128 03/27/19 0351 03/28/19 0532  Weight: 40.8 kg 39.4 kg 39.3 kg    History of present illness:  59 white female prior Lyme disease 8016, 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  Cellulitis started vancomycin Orthopedic consulted recommended ABIs and vascular consult underwent angiogram 2/26  Hospital Course:  Right lower extremity wound VVS did Angio and Angioplasited focal R SFA with drug eluting balloon--needs ASA plavix and statin going forward Continue Rocephin today--liekly narrow to orals as per surgeons, complete Keflex on 3/9 Therapy felt she could go home with home health Wound care as  above Follicular adenoma of thyroid with thyroidectomy and clinical hypothyroidism TSH elevated-will start low dose synbtrhoid 88 mcg--recheck in about 3 weeks AKI on admission Improved prior to admission Outpatient labs Mast cell disease Fecal/urinary incontinence status post corrective surgery 12/2018 Seems stable at this time Memory loss?  Dementia Unable to orient to place person-note agitation overnight We will discuss with family going forward plan of care-has neurology appointment in the next several weeks Prior Lyme disease History of Normocytic anemia Stable Asymptomatic bacteriuria Antibiotics that she is on for her right lower extremity wound should treat  Procedures:  Angiogram performed 03/25/18 21  Consultations:  Vascular surgery orthopedics  Discharge Exam: Vitals:   03/27/19 2123 03/28/19 0532  BP: (!) 143/58 (!) 152/96  Pulse: 74 65  Resp: 20 18  Temp: 97.8 F (36.6 C) 97.9 F (36.6 C)  SpO2: 97% 99%    General: Awake alert but confused and rambles does not make much sense Cardiovascular: S1-S2 no murmur rub or gallop no rales no rhonchi Respiratory: Clinically clear no added sound Abdomen slight distention mild discomfort No rebound no guarding  Discharge Instructions    Allergies as of 03/28/2019      Reactions   Methylprednisolone    n/v,tingling n/v,tingling   Peanut-containing Drug Products Anaphylaxis   Cholestyramine    chest pain chest pain   Soybean (diagnostic)    Sulfa Antibiotics       Medication List    TAKE these medications   acetaminophen 325 MG tablet Commonly known as: TYLENOL Take 2 tablets (650 mg total) by mouth every 6 (six) hours as needed for mild pain (or Fever >/=  101).   aspirin 81 MG EC tablet Take 1 tablet (81 mg total) by mouth daily.   cephALEXin 500 MG capsule Commonly known as: KEFLEX Take 1 capsule (500 mg total) by mouth every 12 (twelve) hours.   clopidogrel 75 MG tablet Commonly known as:  PLAVIX Take 1 tablet (75 mg total) by mouth daily with breakfast.   feeding supplement (PRO-STAT SUGAR FREE 64) Liqd Take 30 mLs by mouth daily.   levothyroxine 88 MCG tablet Commonly known as: SYNTHROID Take 88 mcg by mouth daily.   rosuvastatin 10 MG tablet Commonly known as: CRESTOR Take 1 tablet (10 mg total) by mouth daily at 6 PM.      Allergies  Allergen Reactions  . Methylprednisolone     n/v,tingling n/v,tingling   . Peanut-Containing Drug Products Anaphylaxis  . Cholestyramine     chest pain chest pain   . Soybean (Diagnostic)   . Sulfa Antibiotics       The results of significant diagnostics from this hospitalization (including imaging, microbiology, ancillary and laboratory) are listed below for reference.    Significant Diagnostic Studies: DG Chest 2 View  Result Date: 03/23/2019 CLINICAL DATA:  Infection, history of Lyme disease and thyroid cancer EXAM: CHEST - 2 VIEW COMPARISON:  None. FINDINGS: Frontal and lateral views of the chest demonstrate an unremarkable cardiac silhouette. The lungs are hyperinflated with background interstitial prominence compatible with emphysema. No airspace disease, effusion, or pneumothorax. No acute bony abnormalities. IMPRESSION: 1. Emphysema.  No acute airspace disease. Electronically Signed   By: Randa Ngo M.D.   On: 03/23/2019 22:02   DG Tibia/Fibula Right  Result Date: 03/24/2019 CLINICAL DATA:  Right leg pain and swelling, erythema and drainage EXAM: RIGHT TIBIA AND FIBULA - 2 VIEW COMPARISON:  None. FINDINGS: Frontal and lateral views of the right tibia and fibula are obtained. There are no acute or destructive bony lesions. Soft tissue defect lateral right lower leg consistent with ulceration. There is diffuse soft tissue edema. IMPRESSION: 1. Soft tissue ulceration lateral right lower leg with associated diffuse soft tissue edema. Findings compatible with cellulitis. 2. No acute or destructive bony lesions.  Electronically Signed   By: Randa Ngo M.D.   On: 03/24/2019 02:16   PERIPHERAL VASCULAR CATHETERIZATION  Result Date: 03/26/2019 Procedure: Abdominal aortogram with bilateral lower extremity runoff, angioplasty right superficial femoral artery (6 x 40 drug-coated balloon) Preoperative diagnosis: Nonhealing wound right leg Postoperative diagnosis: Same Anesthesia: Local Operative findings: Focal right SFA stenosis greater than 90% angioplastied to 0% residual stenosis with drug-eluting balloon Operative details: After team informed consent, patient taken to the [place] lab.  She was placed in supine position on the angio table.  Both groins were prepped and draped in usual sterile fashion.  Local anesthesia was demonstrated over the left common femoral artery.  Using ultrasound guidance an introducer needle was used to cannulate the left common femoral artery and an 035 versa core wire threaded up the abdominal aorta under fluoroscopic guidance.  5 French sheath placed over the guidewire in the left common femoral artery.  This was thoroughly flushed heparinized saline.  A 5 French pigtail catheter was then advanced up in the abdominal aorta and abdominal aortogram obtained in AP projection.  Left and right renal arteries are patent.  Infrarenal abdominal aorta is patent.  Left and right common internal/external iliac arteries are all widely patent with no focal stenosis. At this point bilateral lower extremity runoff views were obtained through the pigtail catheter. In  the right lower extremity, the right common femoral profunda femoris artery widely patent.  Right superficial femoral artery is patent except for a focal greater than 90% stenosis at the level of the adductor hiatus which is about 2 mm in length.  The remainder of the popliteal artery is patent.  There is three-vessel runoff to the right foot.  There is a blush of contrast adjacent to the ulcer on the leg perfused by collaterals. In the left lower  extremity, there are similar findings although the stenosis at the adductor hiatus is slightly less than on the right. At this point was decided to intervene on the right SFA stenosis.  Pigtail catheter was removed over guidewire and exchanged for a 5 [place] crossover catheter.  This was used selectively catheterize the right common iliac artery and then I advanced an 035 angled Glidewire down into the mid right SFA.  This was then swapped out for a versa core wire and a 6 [place] long sheath advanced up and over the aortic bifurcation and into the right superficial femoral artery.  Patient was given a total of 7000 units of intravenous heparin.  She was initially given a 5000 bolus and ACT was in the 190 range.  ACT was not repeated after the additional 2000 units of heparin.  At this point a V 18 wire was placed with a quick cross catheter and I was able to advance the V 18 wire easily across the lesion.  It was then predilated with a 640 Mustang balloon and then after 1 minute of predilation if this was swapped out for a 640 Ranger drug-eluting balloon and this was inflated and centered on the lesion for 3 minutes.  This was done at 8 atm for both inflations.  Completion angiogram showed no evidence of dissection widely patent SFA intact three-vessel runoff.  At this point the guidewires were removed and the sheath swapped out for a short sheath over a versa core wire.  This was thoroughly flushed heparinized saline.  The patient tolerated procedure well and there were no complications.  Patient was taken the holding area in stable condition. Ruta Hinds, MD [facility] Office: [phone]   VAS [place] ABI WITH/WO TBI  Result Date: 03/24/2019 LOWER EXTREMITY DOPPLER STUDY Indications: Ulceration, and gangrene.  Comparison Study: No prior exam. Performing Technologist: Baldwin Crown RVT, RDMS  Examination Guidelines: A complete evaluation includes at minimum, Doppler waveform  signals and systolic blood pressure reading at the level of bilateral brachial, anterior tibial, and posterior tibial arteries, when vessel segments are accessible. Bilateral testing is considered an integral part of a complete examination. Photoelectric Plethysmograph (PPG) waveforms and toe systolic pressure readings are included as required and additional duplex testing as needed. Limited examinations for reoccurring indications may be performed as noted.  ABI Findings: +---------+------------------+-----+----------+--------+ Right    Rt Pressure (mmHg)IndexWaveform  Comment  +---------+------------------+-----+----------+--------+ Brachial 142                    triphasic          +---------+------------------+-----+----------+--------+ PTA      92                0.65 monophasic         +---------+------------------+-----+----------+--------+ DP       99                0.70 monophasic         +---------+------------------+-----+----------+--------+ Maura Crandall  0.27 Abnormal           +---------+------------------+-----+----------+--------+ +---------+------------------+-----+---------+-------+ Left     Lt Pressure (mmHg)IndexWaveform Comment +---------+------------------+-----+---------+-------+ Brachial 137                    triphasic        +---------+------------------+-----+---------+-------+ PTA      133               0.94 triphasic        +---------+------------------+-----+---------+-------+ DP       134               0.94 triphasic        +---------+------------------+-----+---------+-------+ Great Toe105               0.74 Normal           +---------+------------------+-----+---------+-------+ +-------+-----------+-----------+------------+------------+ ABI/TBIToday's ABIToday's TBIPrevious ABIPrevious TBI +-------+-----------+-----------+------------+------------+ Right  0.70       0.27                                 +-------+-----------+-----------+------------+------------+ Left   0.94       .074                                +-------+-----------+-----------+------------+------------+  Summary: Right: Resting right ankle-brachial index indicates moderate right lower extremity arterial disease. The right toe-brachial index is abnormal. Left: Resting left ankle-brachial index indicates mild left lower extremity arterial disease. The left toe-brachial index is normal.  *See table(s) above for measurements and observations.  Electronically signed by Harold Barban MD on 03/24/2019 at 10:31:26 PM.   Final     Microbiology: Recent Results (from the past 240 hour(s))  SARS Coronavirus 2 by RT PCR     Status: None   Collection Time: 03/24/19  2:27 AM  Result Value Ref Range Status   SARS Coronavirus 2 NEGATIVE NEGATIVE Final    Comment: (NOTE) Result indicates the ABSENCE of SARS-CoV-2 RNA in the patient specimen.  The lowest concentration of SARS-CoV-2 viral copies this assay can detect in nasopharyngeal swab specimens is 500 copies / mL.  A negative result does not preclude SARS-CoV-2 infection and should not be used as the sole basis for patient management decisions. A negative result may occur with improper specimen collection / handling, submission of a specimen other than nasopharyngeal swab, presence of viral mutation(s) within the areas targeted by this assay, and inadequate number of viral copies (<500 copies / mL) present.  Negative results must be combined with clinical observations, patient history, and epidemiological information.  The expected result is NEGATIVE.  Patient Fact Sheet:  BlogSelections.co.uk   Provider Fact Sheet:  https://lucas.com/   This test is not yet approved or cleared by the [place] FDA and  has been authorized for  detection and/or diagnosis of SARS-CoV-2 by FDA under an Emergency Use Authorization (EUA).  This EUA  will remain in effect (meaning this test can be used) for the duration of  the COVID-19 declaration under Section 564(b)(1) of the Act, 21 U.S.C. section 360bbb-3(b)(1), unless the authorization is terminated or revoked sooner Performed at [facility], [place] [address], [place], [place] [address]   Culture, Urine     Status: Abnormal   Collection Time: 03/24/19  4:47 PM   Specimen: Urine, Random  Result Value Ref Range Status   Specimen  Description URINE, RANDOM  Final   Special Requests NONE  Final   Culture (A)  Final    <10,000 COLONIES/mL INSIGNIFICANT GROWTH Performed at [facility], [place] [address] [place],  [address]    Report Status 03/25/2019 FINAL  Final     Labs: Basic Metabolic Panel: Recent Labs  Lab 03/23/19 2140 03/25/19 0255 03/26/19 0541 03/28/19 0256  NA 146* 140 141 142  K 3.7 4.1 4.1 4.0  CL 107 108 102 106  CO2 29 28 32 29  GLUCOSE 87 90 85 92  BUN 34* 30* 20 12  CREATININE 0.67 0.70 0.67 0.54  CALCIUM 9.0 7.9* 8.3* 8.1*   Liver Function Tests: Recent Labs  Lab 03/23/19 2140 03/28/19 0256  AST 24 18  ALT 23 14  ALKPHOS 91 71  BILITOT 0.3 0.8  PROT 6.3* 5.6*  ALBUMIN 3.6 2.9*   No results for input(s): LIPASE, AMYLASE in the last 168 hours. No results for input(s): AMMONIA in the last 168 hours. CBC: Recent Labs  Lab 03/23/19 2140 03/26/19 0541 03/28/19 0256  WBC 5.3 4.1 5.1  NEUTROABS 3.9  --  3.9  HGB 10.1* 11.2* 9.7*  HCT 33.6* 35.2* 31.5*  MCV 96.6 93.4 94.3  PLT 221 196 205   Cardiac Enzymes: No results for input(s): CKTOTAL, CKMB, CKMBINDEX, TROPONINI in the last 168 hours. BNP: BNP (last 3 results) No results for input(s): BNP in the last 8760 hours.  ProBNP (last 3 results) No results for input(s): PROBNP in the last 8760 hours.  CBG: No results for input(s): GLUCAP in the last 168 hours.     Signed:  Nita Sells MD   [facility] 03/28/2019, 8:21 AM

## 2019-03-29 LAB — T3, FREE: T3, Free: 0.8 pg/mL — ABNORMAL LOW (ref 2.0–4.4)

## 2019-03-29 NOTE — Care Management (Signed)
1614 03-29-19 Late Entry: Case Manager received-note from previous Case Manager to call [facility] -[phone]. Case Manager did speak with intake and they can accept the patient for Home Health RN/PT Services. Case Manager called the husband and he provided verbal permission to fax paperwork to [phone]. [facility] will be able to start care within 24-48 hours post transition home. No further needs from Case Manager at this time. Bethena Roys, RN,BSN Case Manager

## 2019-04-21 ENCOUNTER — Other Ambulatory Visit: Payer: Self-pay | Admitting: *Deleted

## 2019-04-21 DIAGNOSIS — L97211 Non-pressure chronic ulcer of right calf limited to breakdown of skin: Secondary | ICD-10-CM

## 2019-04-21 DIAGNOSIS — I739 Peripheral vascular disease, unspecified: Secondary | ICD-10-CM

## 2019-04-22 ENCOUNTER — Ambulatory Visit (HOSPITAL_COMMUNITY): Payer: No Typology Code available for payment source | Attending: Vascular Surgery

## 2019-04-22 ENCOUNTER — Ambulatory Visit (HOSPITAL_COMMUNITY): Payer: No Typology Code available for payment source

## 2021-08-17 IMAGING — CR DG CHEST 2V
2 series · 2 of 2 positions shown · non-contrast
Comparison: None.

CLINICAL DATA: Infection, history of Lyme disease and thyroid
cancer

EXAM:
CHEST - 2 VIEW

[chest lat]
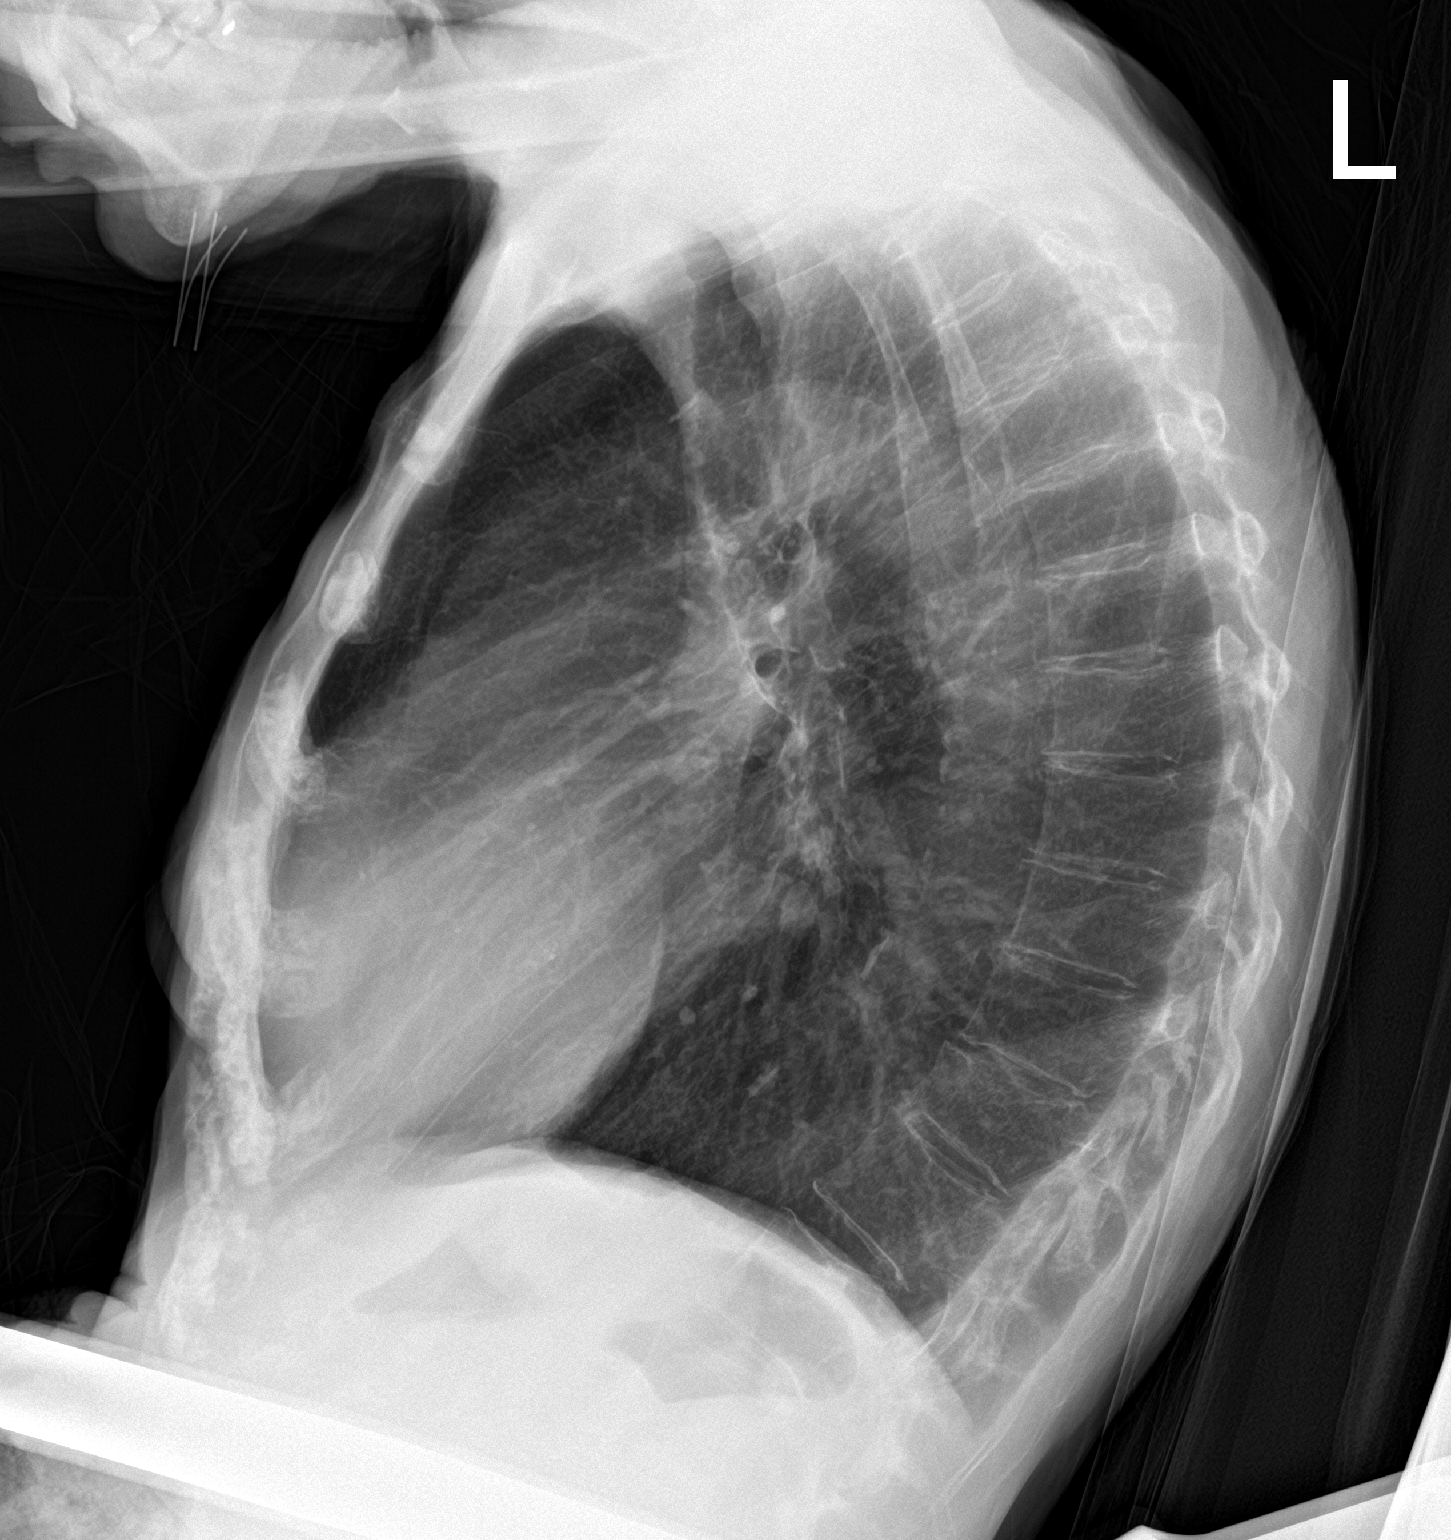

[chest ap]
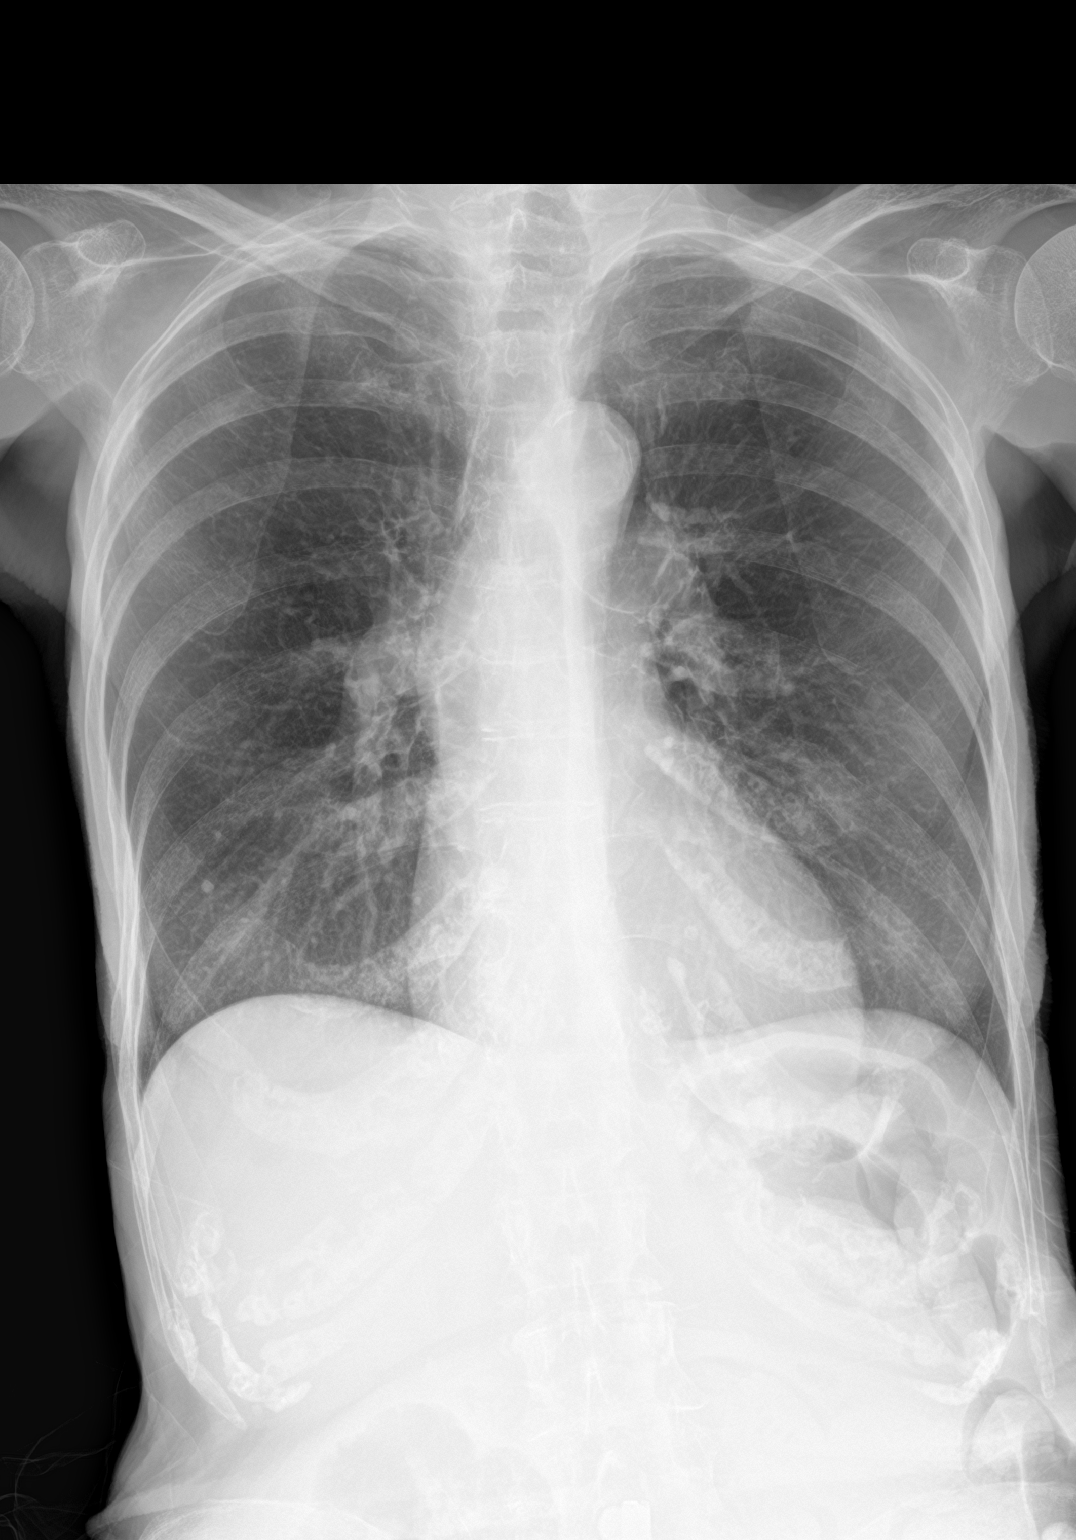

[2 of 2 positions shown; findings below may reference images not displayed]

FINDINGS: Frontal and lateral views of the chest demonstrate an unremarkable
cardiac silhouette. The lungs are hyperinflated with background
interstitial prominence compatible with emphysema. No airspace
disease, effusion, or pneumothorax. No acute bony abnormalities.
IMPRESSION: 1. Emphysema.  No acute airspace disease.
